# Patient Record
Sex: Female | Born: 1960 | Race: Black or African American | Hispanic: No | Marital: Married | Smoking: Never smoker
Health system: Southern US, Community
[De-identification: ages and names within clinical notes are randomized; demographics above are authoritative.]

## PROBLEM LIST (undated history)

## (undated) DIAGNOSIS — E669 Obesity, unspecified: Secondary | ICD-10-CM

## (undated) HISTORY — PX: OTHER SURGICAL HISTORY: SHX169

## (undated) HISTORY — PX: SHOULDER SURGERY: SHX246

---

## 2006-01-09 ENCOUNTER — Inpatient Hospital Stay (HOSPITAL_COMMUNITY): Admission: AD | Admit: 2006-01-09 | Discharge: 2006-01-09 | Payer: Self-pay | Admitting: Family Medicine

## 2006-08-10 ENCOUNTER — Emergency Department (HOSPITAL_COMMUNITY): Admission: EM | Admit: 2006-08-10 | Discharge: 2006-08-10 | Payer: Self-pay | Admitting: Emergency Medicine

## 2006-10-26 ENCOUNTER — Emergency Department (HOSPITAL_COMMUNITY): Admission: EM | Admit: 2006-10-26 | Discharge: 2006-10-26 | Payer: Self-pay | Admitting: Emergency Medicine

## 2006-12-04 ENCOUNTER — Emergency Department (HOSPITAL_COMMUNITY): Admission: EM | Admit: 2006-12-04 | Discharge: 2006-12-04 | Payer: Self-pay | Admitting: Emergency Medicine

## 2007-03-10 ENCOUNTER — Emergency Department (HOSPITAL_COMMUNITY): Admission: EM | Admit: 2007-03-10 | Discharge: 2007-03-10 | Payer: Self-pay | Admitting: Emergency Medicine

## 2007-04-14 ENCOUNTER — Emergency Department (HOSPITAL_COMMUNITY): Admission: EM | Admit: 2007-04-14 | Discharge: 2007-04-14 | Payer: Self-pay | Admitting: Emergency Medicine

## 2007-06-13 ENCOUNTER — Emergency Department (HOSPITAL_COMMUNITY): Admission: EM | Admit: 2007-06-13 | Discharge: 2007-06-13 | Payer: Self-pay | Admitting: Emergency Medicine

## 2007-07-19 ENCOUNTER — Emergency Department (HOSPITAL_COMMUNITY): Admission: EM | Admit: 2007-07-19 | Discharge: 2007-07-20 | Payer: Self-pay | Admitting: Emergency Medicine

## 2007-09-13 ENCOUNTER — Emergency Department (HOSPITAL_COMMUNITY): Admission: EM | Admit: 2007-09-13 | Discharge: 2007-09-13 | Payer: Self-pay | Admitting: Emergency Medicine

## 2008-03-29 ENCOUNTER — Emergency Department (HOSPITAL_COMMUNITY): Admission: EM | Admit: 2008-03-29 | Discharge: 2008-03-29 | Payer: Self-pay | Admitting: Emergency Medicine

## 2008-09-01 ENCOUNTER — Emergency Department (HOSPITAL_COMMUNITY): Admission: EM | Admit: 2008-09-01 | Discharge: 2008-09-01 | Payer: Self-pay | Admitting: Emergency Medicine

## 2008-12-22 ENCOUNTER — Emergency Department (HOSPITAL_COMMUNITY): Admission: EM | Admit: 2008-12-22 | Discharge: 2008-12-22 | Payer: Self-pay | Admitting: Emergency Medicine

## 2009-04-11 ENCOUNTER — Emergency Department (HOSPITAL_COMMUNITY): Admission: EM | Admit: 2009-04-11 | Discharge: 2009-04-11 | Payer: Self-pay | Admitting: Emergency Medicine

## 2009-07-13 ENCOUNTER — Emergency Department (HOSPITAL_COMMUNITY): Admission: EM | Admit: 2009-07-13 | Discharge: 2009-07-13 | Payer: Self-pay | Admitting: Emergency Medicine

## 2009-08-11 ENCOUNTER — Emergency Department (HOSPITAL_COMMUNITY): Admission: EM | Admit: 2009-08-11 | Discharge: 2009-08-12 | Payer: Self-pay | Admitting: Emergency Medicine

## 2009-10-10 ENCOUNTER — Emergency Department (HOSPITAL_COMMUNITY): Admission: EM | Admit: 2009-10-10 | Discharge: 2009-10-10 | Payer: Self-pay | Admitting: Emergency Medicine

## 2010-02-07 ENCOUNTER — Emergency Department (HOSPITAL_COMMUNITY): Admission: EM | Admit: 2010-02-07 | Discharge: 2010-02-07 | Payer: Self-pay | Admitting: Emergency Medicine

## 2010-02-10 ENCOUNTER — Emergency Department (HOSPITAL_COMMUNITY): Admission: EM | Admit: 2010-02-10 | Discharge: 2010-02-11 | Payer: Self-pay | Admitting: Emergency Medicine

## 2010-03-05 ENCOUNTER — Emergency Department (HOSPITAL_COMMUNITY): Admission: EM | Admit: 2010-03-05 | Discharge: 2010-03-05 | Payer: Self-pay | Admitting: Emergency Medicine

## 2010-03-12 ENCOUNTER — Emergency Department (HOSPITAL_COMMUNITY): Admission: EM | Admit: 2010-03-12 | Discharge: 2010-03-12 | Payer: Self-pay | Admitting: Emergency Medicine

## 2010-05-11 ENCOUNTER — Emergency Department (HOSPITAL_COMMUNITY)
Admission: EM | Admit: 2010-05-11 | Discharge: 2010-05-11 | Payer: Self-pay | Source: Home / Self Care | Admitting: Emergency Medicine

## 2010-08-08 LAB — URINALYSIS, ROUTINE W REFLEX MICROSCOPIC
Glucose, UA: 100 mg/dL — AB
Hgb urine dipstick: NEGATIVE
Specific Gravity, Urine: 1.013 (ref 1.005–1.030)

## 2010-08-18 LAB — CBC
HCT: 35.7 % — ABNORMAL LOW (ref 36.0–46.0)
MCV: 90 fL (ref 78.0–100.0)
Platelets: 238 10*3/uL (ref 150–400)
RBC: 3.96 MIL/uL (ref 3.87–5.11)
WBC: 5.9 10*3/uL (ref 4.0–10.5)

## 2010-08-18 LAB — DIFFERENTIAL
Basophils Absolute: 0.1 10*3/uL (ref 0.0–0.1)
Lymphocytes Relative: 36 % (ref 12–46)
Lymphs Abs: 2.2 10*3/uL (ref 0.7–4.0)
Monocytes Absolute: 0.5 10*3/uL (ref 0.1–1.0)
Monocytes Relative: 8 % (ref 3–12)
Neutrophils Relative %: 51 % (ref 43–77)

## 2010-08-18 LAB — POCT CARDIAC MARKERS: Troponin i, poc: <0.05 ng/mL (ref 0.00–0.09)

## 2010-08-18 LAB — COMPREHENSIVE METABOLIC PANEL
ALT: 16 U/L (ref 0–35)
Albumin: 3.7 g/dL (ref 3.5–5.2)
Alkaline Phosphatase: 64 U/L (ref 39–117)
CO2: 29 mEq/L (ref 19–32)
Calcium: 9.2 mg/dL (ref 8.4–10.5)
Chloride: 108 mEq/L (ref 96–112)
Creatinine, Ser: 0.87 mg/dL (ref 0.4–1.2)
Glucose, Bld: 106 mg/dL — ABNORMAL HIGH (ref 70–99)
Potassium: 3.9 mEq/L (ref 3.5–5.1)

## 2010-08-28 LAB — RAPID STREP SCREEN (MED CTR MEBANE ONLY): Streptococcus, Group A Screen (Direct): NEGATIVE

## 2010-09-04 LAB — BASIC METABOLIC PANEL
CO2: 28 mEq/L (ref 19–32)
Chloride: 107 mEq/L (ref 96–112)
Creatinine, Ser: 0.98 mg/dL (ref 0.4–1.2)
GFR calc Af Amer: >60 mL/min (ref >60)
GFR calc non Af Amer: >60 mL/min (ref >60)
Glucose, Bld: 119 mg/dL — ABNORMAL HIGH (ref 70–99)
Potassium: 4.2 mEq/L (ref 3.5–5.1)
Sodium: 140 mEq/L (ref 135–145)

## 2010-09-04 LAB — URINALYSIS, ROUTINE W REFLEX MICROSCOPIC
Glucose, UA: NEGATIVE mg/dL
Hgb urine dipstick: NEGATIVE
Specific Gravity, Urine: 1.015 (ref 1.005–1.030)
pH: 6 (ref 5.0–8.0)

## 2010-09-04 LAB — PREGNANCY, URINE: Preg Test, Ur: NEGATIVE

## 2010-09-04 LAB — CBC: WBC: 7.1 10*3/uL (ref 4.0–10.5)

## 2010-11-01 ENCOUNTER — Emergency Department (HOSPITAL_COMMUNITY)
Admission: EM | Admit: 2010-11-01 | Discharge: 2010-11-01 | Disposition: A | Discharge status: Home / Self Care | Payer: Self-pay | Attending: Emergency Medicine | Admitting: Emergency Medicine

## 2010-11-01 DIAGNOSIS — E669 Obesity, unspecified: Secondary | ICD-10-CM | POA: Insufficient documentation

## 2010-11-01 DIAGNOSIS — R42 Dizziness and giddiness: Secondary | ICD-10-CM | POA: Insufficient documentation

## 2010-11-01 DIAGNOSIS — R55 Syncope and collapse: Secondary | ICD-10-CM | POA: Insufficient documentation

## 2010-11-01 DIAGNOSIS — J45909 Unspecified asthma, uncomplicated: Secondary | ICD-10-CM | POA: Insufficient documentation

## 2010-11-01 DIAGNOSIS — D573 Sickle-cell trait: Secondary | ICD-10-CM | POA: Insufficient documentation

## 2010-11-01 LAB — POCT I-STAT, CHEM 8
BUN: 12 mg/dL (ref 6–23)
Chloride: 105 mEq/L (ref 96–112)
Creatinine, Ser: 1.1 mg/dL (ref 0.4–1.2)
Glucose, Bld: 103 mg/dL — ABNORMAL HIGH (ref 70–99)
HCT: 39 % (ref 36.0–46.0)
Hemoglobin: 13.3 g/dL (ref 12.0–15.0)
Potassium: 4.1 mEq/L (ref 3.5–5.1)
Sodium: 145 mEq/L (ref 135–145)

## 2011-02-18 LAB — DIFFERENTIAL
Basophils Absolute: 0
Basophils Relative: 1
Lymphs Abs: 2
Monocytes Absolute: 0.5
Neutrophils Relative %: 63

## 2011-02-18 LAB — POCT CARDIAC MARKERS
Myoglobin, poc: 104
Myoglobin, poc: 86.8
Operator id: 151321
Operator id: 151321
Troponin i, poc: <0.05
Troponin i, poc: <0.05

## 2011-02-18 LAB — CBC
HCT: 35.7 — ABNORMAL LOW
Hemoglobin: 12.1
MCHC: 34.1
MCV: 88.9
Platelets: 245
RDW: 14.2

## 2011-02-18 LAB — POCT I-STAT, CHEM 8
Calcium, Ion: 1.09 — ABNORMAL LOW
HCT: 38
Sodium: 141

## 2011-03-05 LAB — DIFFERENTIAL
Lymphs Abs: 2.2
Monocytes Relative: 5
Neutro Abs: 4.5
Neutrophils Relative %: 62

## 2011-03-05 LAB — CBC
HCT: 35.9 — ABNORMAL LOW
Hemoglobin: 12.1
MCHC: 33.7
RDW: 13.7

## 2011-03-05 LAB — COMPREHENSIVE METABOLIC PANEL
BUN: 10
Calcium: 8.9
Glucose, Bld: 96
Total Protein: 6.8

## 2011-06-25 ENCOUNTER — Emergency Department (HOSPITAL_COMMUNITY)
Admission: EM | Admit: 2011-06-25 | Discharge: 2011-06-25 | Disposition: A | Discharge status: Home / Self Care | Payer: Self-pay | Attending: Emergency Medicine | Admitting: Emergency Medicine

## 2011-06-25 ENCOUNTER — Encounter (HOSPITAL_COMMUNITY): Payer: Self-pay | Admitting: *Deleted

## 2011-06-25 DIAGNOSIS — K089 Disorder of teeth and supporting structures, unspecified: Secondary | ICD-10-CM | POA: Insufficient documentation

## 2011-06-25 DIAGNOSIS — R221 Localized swelling, mass and lump, neck: Secondary | ICD-10-CM | POA: Insufficient documentation

## 2011-06-25 DIAGNOSIS — J45909 Unspecified asthma, uncomplicated: Secondary | ICD-10-CM | POA: Insufficient documentation

## 2011-06-25 DIAGNOSIS — K029 Dental caries, unspecified: Secondary | ICD-10-CM | POA: Insufficient documentation

## 2011-06-25 DIAGNOSIS — R220 Localized swelling, mass and lump, head: Secondary | ICD-10-CM | POA: Insufficient documentation

## 2011-06-25 HISTORY — DX: Obesity, unspecified: E66.9

## 2011-06-25 MED ORDER — OXYCODONE-ACETAMINOPHEN 5-325 MG PO TABS
1.0000 | ORAL_TABLET | Freq: Once | ORAL | Status: AC
  Administered 2011-06-25: 1 via ORAL
  Filled 2011-06-25: qty 1

## 2011-06-25 MED ORDER — PENICILLIN V POTASSIUM 500 MG PO TABS: 500.0000 mg | ORAL_TABLET | Freq: Three times a day (TID) | ORAL | Status: AC

## 2011-06-25 MED ORDER — HYDROCODONE-ACETAMINOPHEN 5-500 MG PO TABS: 1.0000 | ORAL_TABLET | Freq: Four times a day (QID) | ORAL | Status: AC | PRN

## 2011-06-25 NOTE — ED Provider Notes (Signed)
Medical screening examination/treatment/procedure(s) were performed by non-physician practitioner and as supervising physician I was immediately available for consultation/collaboration.  Maikol Grassia L Husain Costabile, MD 06/25/11 1704 

## 2011-06-25 NOTE — ED Provider Notes (Signed)
History     CSN: [id number]  Arrival date & time 06/25/11  4098   First MD Initiated Contact with Patient 06/25/11 [phone]      Chief Complaint  Patient presents with  . Dental Pain    (Consider location/radiation/quality/duration/timing/severity/associated sxs/prior treatment) Patient is a 51 y.o. female presenting with tooth pain. The history is provided by the patient.  Dental PainThe primary symptoms include mouth pain. Primary symptoms do not include oral bleeding, fever or sore throat. The symptoms began 2 to 6 hours ago. The symptoms are worsening. The symptoms occur constantly.  Additional symptoms include: facial swelling. Additional symptoms do not include: ear pain.  Pt reports decayed tooth on the left lower jaw. States pain on and off for a year, but never as bad as it is now. States pain woke her up from sleep in the middle of the night. Pain worsened with chewing. States feels like left face is swelling. Denies fever, chills, malaise, difficulty swallowing.   Past Medical History  Diagnosis Date  . Asthma   . Obesity     History reviewed. No pertinent past surgical history.  History reviewed. No pertinent family history.  History  Substance Use Topics  . Smoking status: Not on file  . Smokeless tobacco: Not on file  . Alcohol Use: No    OB History    Grav Para Term Preterm Abortions TAB SAB Ect Mult Living                  Review of Systems  Constitutional: Negative for fever and chills.  HENT: Positive for facial swelling and dental problem. Negative for ear pain, congestion, sore throat, neck pain and neck stiffness.   Eyes: Negative.   Respiratory: Negative.   Cardiovascular: Negative.   Gastrointestinal: Negative.   Genitourinary: Negative.   Musculoskeletal: Negative.   Skin: Negative.   Neurological: Negative.   Psychiatric/Behavioral: Negative.     Allergies  Review of patient's allergies indicates no known allergies.  Home Medications    Current Outpatient Rx  Name Route Sig Dispense Refill  . ACETAMINOPHEN 500 MG PO TABS Oral Take 1,000 mg by mouth every 6 (six) hours as needed. pain    . IBUPROFEN 200 MG PO TABS Oral Take 400 mg by mouth every 6 (six) hours as needed. pain    . THERA M PLUS PO TABS Oral Take 1 tablet by mouth daily.      BP 155/83  Pulse 71  Temp(Src) 98.4 F (36.9 C) (Oral)  Resp 20  SpO2 96%  Physical Exam  Nursing note and vitals reviewed. Constitutional: She is oriented to person, place, and time. She appears well-developed and well-nourished. No distress.  HENT:  Head: Normocephalic.  Right Ear: External ear normal.  Left Ear: External ear normal.  Nose: Nose normal.  Mouth/Throat: Oropharynx is clear and moist.       No obvious facial swelling noted or palpated. Poor dentition. Left 1st molar decayed all the way to the gum. Surrounding gum is erythemous. No drainage, no palpable abscess.  Eyes: Conjunctivae are normal.  Neck: Neck supple.  Cardiovascular: Normal rate, regular rhythm and normal heart sounds.   Pulmonary/Chest: Effort normal and breath sounds normal. No respiratory distress.  Lymphadenopathy:    She has no cervical adenopathy.  Neurological: She is alert and oriented to person, place, and time.  Skin: Skin is warm and dry. No erythema.  Psychiatric: She has a normal mood and affect.    ED  Course  Procedures (including critical care time)  Exam consistent with dental carries and possible early infection. Will start on antibiotic, pain management, and follow up with oral surgery. I suspect what is remaining of the tooth will have to by incised out.  No diagnosis found.    MDM          Lottie Mussel, PA 06/25/11 0900

## 2011-06-25 NOTE — ED Notes (Signed)
Onset this am of left upper dental pain. Airway intact, resp e/u.

## 2011-08-29 ENCOUNTER — Emergency Department (HOSPITAL_COMMUNITY)
Admission: EM | Admit: 2011-08-29 | Discharge: 2011-08-29 | Disposition: A | Discharge status: Home / Self Care | Payer: Self-pay | Attending: Emergency Medicine | Admitting: Emergency Medicine

## 2011-08-29 ENCOUNTER — Encounter (HOSPITAL_COMMUNITY): Payer: Self-pay | Admitting: *Deleted

## 2011-08-29 DIAGNOSIS — S335XXA Sprain of ligaments of lumbar spine, initial encounter: Secondary | ICD-10-CM | POA: Insufficient documentation

## 2011-08-29 DIAGNOSIS — S39012A Strain of muscle, fascia and tendon of lower back, initial encounter: Secondary | ICD-10-CM

## 2011-08-29 DIAGNOSIS — J45909 Unspecified asthma, uncomplicated: Secondary | ICD-10-CM | POA: Insufficient documentation

## 2011-08-29 DIAGNOSIS — X58XXXA Exposure to other specified factors, initial encounter: Secondary | ICD-10-CM | POA: Insufficient documentation

## 2011-08-29 DIAGNOSIS — E669 Obesity, unspecified: Secondary | ICD-10-CM | POA: Insufficient documentation

## 2011-08-29 MED ORDER — HYDROCODONE-ACETAMINOPHEN 5-500 MG PO TABS: 1.0000 | ORAL_TABLET | Freq: Four times a day (QID) | ORAL | Status: AC | PRN

## 2011-08-29 MED ORDER — CYCLOBENZAPRINE HCL 10 MG PO TABS: 10.0000 mg | ORAL_TABLET | Freq: Two times a day (BID) | ORAL | Status: AC | PRN

## 2011-08-29 MED ORDER — OXYCODONE-ACETAMINOPHEN 5-325 MG PO TABS: 2.0000 | ORAL_TABLET | ORAL | Status: AC | PRN

## 2011-08-29 NOTE — ED Provider Notes (Signed)
History     CSN: [id number]  Arrival date & time 08/29/11  1637   First MD Initiated Contact with Patient 08/29/11 1816      Chief Complaint  Patient presents with  . Back Pain    (Consider location/radiation/quality/duration/timing/severity/associated sxs/prior treatment) Patient is a 51 y.o. female presenting with back pain. The history is provided by the patient.  Back Pain  This is a recurrent problem. The current episode started more than 2 days ago. The problem occurs constantly. The problem has not changed since onset.The pain is associated with no known injury. The pain is present in the lumbar spine. The quality of the pain is described as aching. Radiates to: right flank. The pain is at a severity of 6/10. The pain is moderate. The symptoms are aggravated by bending, certain positions and twisting. Pertinent negatives include no perianal numbness, no bladder incontinence, no dysuria, no leg pain, no paresthesias, no paresis, no tingling and no weakness.    Past Medical History  Diagnosis Date  . Asthma   . Obesity     History reviewed. No pertinent past surgical history.  History reviewed. No pertinent family history.  History  Substance Use Topics  . Smoking status: Not on file  . Smokeless tobacco: Not on file  . Alcohol Use: No    OB History    Grav Para Term Preterm Abortions TAB SAB Ect Mult Living                  Review of Systems  Genitourinary: Negative for bladder incontinence and dysuria.  Musculoskeletal: Positive for back pain.  Neurological: Negative for tingling, weakness and paresthesias.  All other systems reviewed and are negative.    Allergies  Review of patient's allergies indicates no known allergies.  Home Medications   Current Outpatient Rx  Name Route Sig Dispense Refill  . IBUPROFEN 200 MG PO TABS Oral Take 400 mg by mouth every 6 (six) hours as needed. pain    . THERA M PLUS PO TABS Oral Take 1 tablet by mouth daily.       BP 140/66  Pulse 60  Temp(Src) 97.9 F (36.6 C) (Oral)  Resp 16  SpO2 99%  Physical Exam  Nursing note and vitals reviewed. Constitutional: She appears well-developed and well-nourished.       Morbidly obese  HENT:  Head: Normocephalic and atraumatic.  Eyes: Conjunctivae and EOM are normal. Pupils are equal, round, and reactive to light.  Neck: Normal range of motion. Neck supple.  Cardiovascular: Normal rate, regular rhythm, normal heart sounds and intact distal pulses.   Pulmonary/Chest: Effort normal and breath sounds normal.  Abdominal: Soft. Bowel sounds are normal.  Musculoskeletal: Normal range of motion.  Neurological: She is alert.  Skin: Skin is warm and dry.  Psychiatric: She has a normal mood and affect. Thought content normal.    ED Course  Procedures (including critical care time)  Labs Reviewed - No data to display No results found.   No diagnosis found.    MDM          Hilario Quarry, MD 09/01/11 [phone]

## 2011-08-29 NOTE — ED Notes (Signed)
To ed for eval of right lower back pain for past 2 days. No known injury

## 2011-08-29 NOTE — Discharge Instructions (Signed)
Back Pain, Adult Low back pain is very common. About 1 in 5 people have back pain.The cause of low back pain is rarely dangerous. The pain often gets better over time.About half of people with a sudden onset of back pain feel better in just 2 weeks. About 8 in 10 people feel better by 6 weeks.  CAUSES Some common causes of back pain include:  Strain of the muscles or ligaments supporting the spine.   Wear and tear (degeneration) of the spinal discs.   Arthritis.   Direct injury to the back.  DIAGNOSIS Most of the time, the direct cause of low back pain is not known.However, back pain can be treated effectively even when the exact cause of the pain is unknown.Answering your caregiver's questions about your overall health and symptoms is one of the most accurate ways to make sure the cause of your pain is not dangerous. If your caregiver needs more information, he or she may order lab work or imaging tests (X-rays or MRIs).However, even if imaging tests show changes in your back, this usually does not require surgery. HOME CARE INSTRUCTIONS For many people, back pain returns.Since low back pain is rarely dangerous, it is often a condition that people can learn to manageon their own.   Remain active. It is stressful on the back to sit or stand in one place. Do not sit, drive, or stand in one place for more than 30 minutes at a time. Take short walks on level surfaces as soon as pain allows.Try to increase the length of time you walk each day.   Do not stay in bed.Resting more than 1 or 2 days can delay your recovery.   Do not avoid exercise or work.Your body is made to move.It is not dangerous to be active, even though your back may hurt.Your back will likely heal faster if you return to being active before your pain is gone.   Pay attention to your body when you bend and lift. Many people have less discomfortwhen lifting if they bend their knees, keep the load close to their  bodies,and avoid twisting. Often, the most comfortable positions are those that put less stress on your recovering back.   Find a comfortable position to sleep. Use a firm mattress and lie on your side with your knees slightly bent. If you lie on your back, put a pillow under your knees.   Only take over-the-counter or prescription medicines as directed by your caregiver. Over-the-counter medicines to reduce pain and inflammation are often the most helpful.Your caregiver may prescribe muscle relaxant drugs.These medicines help dull your pain so you can more quickly return to your normal activities and healthy exercise.   Put ice on the injured area.   Put ice in a plastic bag.   Place a towel between your skin and the bag.   Leave the ice on for 15 to 20 minutes, 3 to 4 times a day for the first 2 to 3 days. After that, ice and heat may be alternated to reduce pain and spasms.   Ask your caregiver about trying back exercises and gentle massage. This may be of some benefit.   Avoid feeling anxious or stressed.Stress increases muscle tension and can worsen back pain.It is important to recognize when you are anxious or stressed and learn ways to manage it.Exercise is a great option.  SEEK MEDICAL CARE IF:  You have pain that is not relieved with rest or medicine.   You have   pain that does not improve in 1 week.   You have new symptoms.   You are generally not feeling well.  SEEK IMMEDIATE MEDICAL CARE IF:   You have pain that radiates from your back into your legs.   You develop new bowel or bladder control problems.   You have unusual weakness or numbness in your arms or legs.   You develop nausea or vomiting.   You develop abdominal pain.   You feel faint.  Document Released: 05/12/2005 Document Revised: 05/01/2011 Document Reviewed: 09/30/2010 [employer] Patient Information 2012 [employer]. 

## 2011-09-28 ENCOUNTER — Emergency Department (HOSPITAL_COMMUNITY)
Admission: EM | Admit: 2011-09-28 | Discharge: 2011-09-28 | Disposition: A | Discharge status: Home / Self Care | Payer: Self-pay | Attending: Emergency Medicine | Admitting: Emergency Medicine

## 2011-09-28 ENCOUNTER — Encounter (HOSPITAL_COMMUNITY): Payer: Self-pay | Admitting: Emergency Medicine

## 2011-09-28 DIAGNOSIS — K0889 Other specified disorders of teeth and supporting structures: Secondary | ICD-10-CM

## 2011-09-28 DIAGNOSIS — X58XXXA Exposure to other specified factors, initial encounter: Secondary | ICD-10-CM | POA: Insufficient documentation

## 2011-09-28 DIAGNOSIS — S025XXA Fracture of tooth (traumatic), initial encounter for closed fracture: Secondary | ICD-10-CM | POA: Insufficient documentation

## 2011-09-28 DIAGNOSIS — K029 Dental caries, unspecified: Secondary | ICD-10-CM | POA: Insufficient documentation

## 2011-09-28 MED ORDER — HYDROCODONE-ACETAMINOPHEN 5-325 MG PO TABS: 1.0000 | ORAL_TABLET | ORAL | Status: DC | PRN

## 2011-09-28 MED ORDER — PENICILLIN V POTASSIUM 500 MG PO TABS: 500.0000 mg | ORAL_TABLET | Freq: Three times a day (TID) | ORAL | Status: DC

## 2011-09-28 MED ORDER — IBUPROFEN 800 MG PO TABS: 800.0000 mg | ORAL_TABLET | Freq: Three times a day (TID) | ORAL | Status: DC

## 2011-09-28 NOTE — Discharge Instructions (Signed)
Dental Pain A tooth ache may be caused by cavities (tooth decay). Cavities expose the nerve of the tooth to air and hot or cold temperatures. It may come from an infection or abscess (also called a boil or furuncle) around your tooth. It is also often caused by dental caries (tooth decay). This causes the pain you are having. DIAGNOSIS  Your caregiver can diagnose this problem by exam. TREATMENT   If caused by an infection, it may be treated with medications which kill germs (antibiotics) and pain medications as prescribed by your caregiver. Take medications as directed.   Only take over-the-counter or prescription medicines for pain, discomfort, or fever as directed by your caregiver.   Whether the tooth ache today is caused by infection or dental disease, you should see your dentist as soon as possible for further care.  SEEK MEDICAL CARE IF: The exam and treatment you received today has been provided on an emergency basis only. This is not a substitute for complete medical or dental care. If your problem worsens or new problems (symptoms) appear, and you are unable to meet with your dentist, call or return to this location. SEEK IMMEDIATE MEDICAL CARE IF:   You have a fever.   You develop redness and swelling of your face, jaw, or neck.   You are unable to open your mouth.   You have severe pain uncontrolled by pain medicine.  MAKE SURE YOU:   Understand these instructions.   Will watch your condition.   Will get help right away if you are not doing well or get worse.  Document Released: 05/12/2005 Document Revised: 05/01/2011 Document Reviewed: 12/29/2007 [facility] Patient Information 2012 [place], [place]. RESOURCE GUIDE  Dental Problems  Patients with Medicaid: [facility]                     [id number]                                           Phone:  [phone]                                                  If unable to pay or uninsured,  contact:  [facility] or [facility]. to become qualified for the adult dental clinic.  Chronic Pain Problems Contact [facility]  [phone] Patients need to be referred by their primary care doctor.  Insufficient Money for Medicine Contact United Way:  call "211" or [facility] [phone].  No Primary Care Doctor Call [facility]  [phone] Other agencies that provide inexpensive medical care    [facility]  [phone]    [facility]  [phone]    [facility]  [phone]    [facility]  [phone]    [facility]  [phone]    [facility]  [phone]  Substance Abuse Resources Alcohol and Drug Services  325-254-2409 [facility] 7202647958 [facility] [place] [phone] Floydene Flock [phone] Residential & Outpatient Substance Abuse Program  ([phone]  Psychological Services [facility] Behavioral Health  [phone] [facility]  [phone] [facility] Mental Health   [phone] (emergency services [phone])  Abuse/Neglect  [facility] Child Abuse Hotline (289) 114-8371 [facility] Child Abuse Hotline 831-351-4326 (After Hours)  Emergency Shelter [facility] Ministries [phone]  Maternity Homes [facility] [place] [facility] [place] (737) [phone] [facility] ([phone]  MRSA Hotline #:   4032592308    [facility] Resources  [facility] [place]  United Way                           [facility] Dept. [address] [place]                     [address]         [address]  Blondell Reveal Phone:  [phone]                                  Phone:  [phone]                   Phone:  [phone]  [facility] Mental Health Phone:  [phone]  [facility] Child Abuse  Hotline [phone] (After Hours)

## 2011-09-28 NOTE — ED Notes (Signed)
Pt c/o right upper tooth pain x 3 days 

## 2011-09-28 NOTE — ED Provider Notes (Signed)
History  Scribed for Nat Christen, MD, the patient was seen in room STRE4/STRE4. This chart was scribed by Candelaria Stagers. The patient's care started at 2:25 PM    CSN: [id number]  Arrival date & time 09/28/11  1355   First MD Initiated Contact with Patient 09/28/11 1418      Chief Complaint  Patient presents with  . Dental Pain     HPI Joan Fisher is a 51 y.o. female who presents to the Emergency Department complaining of a toothache on the top right  that started three days ago.  Pt states that a few months ago the tooth broke.  She denies fever.  Nothing seems to make the pain better or worse.   She has h/o asthma.    Past Medical History  Diagnosis Date  . Asthma   . Obesity     History reviewed. No pertinent past surgical history.  History reviewed. No pertinent family history.  History  Substance Use Topics  . Smoking status: Not on file  . Smokeless tobacco: Not on file  . Alcohol Use: No    OB History    Grav Para Term Preterm Abortions TAB SAB Ect Mult Living                  Review of Systems  Constitutional: Negative for fever.  HENT: Positive for dental problem. Negative for facial swelling.   All other systems reviewed and are negative.    Allergies  Review of patient's allergies indicates no known allergies.  Home Medications   Current Outpatient Rx  Name Route Sig Dispense Refill  . IBUPROFEN 200 MG PO TABS Oral Take 400 mg by mouth every 6 (six) hours as needed. pain    . THERA M PLUS PO TABS Oral Take 1 tablet by mouth daily.      BP 130/71  Pulse 73  Temp(Src) 98.3 F (36.8 C) (Oral)  Resp 18  SpO2 100%  Physical Exam  Nursing note and vitals reviewed. Constitutional: She is oriented to person, place, and time. She appears well-developed and well-nourished. No distress.  HENT:  Head: Normocephalic and atraumatic.       Right upper canine is broken and decayed with mild tenderness to palpation of the gum.  Broken first  molar on the left upper side.  No facial swelling.   Eyes: Conjunctivae and EOM are normal. Pupils are equal, round, and reactive to light.  Neck: Normal range of motion. Neck supple.  Cardiovascular: Normal rate.   Pulmonary/Chest: Effort normal.  Musculoskeletal: Normal range of motion. She exhibits no tenderness.  Lymphadenopathy:    She has no cervical adenopathy.  Neurological: She is alert and oriented to person, place, and time. No cranial nerve deficit.  Skin: Skin is warm and dry. She is not diaphoretic.  Psychiatric: She has a normal mood and affect. Her behavior is normal. Judgment and thought content normal.    ED Course  Procedures  DIAGNOSTIC STUDIES: Oxygen Saturation is 100% on room air, normal by my interpretation.    COORDINATION OF CARE:     Labs Reviewed - No data to display No results found.   No diagnosis found.    MDM  Patient with possible early dental abscess at this time.  We'll place her on penicillin, Norco and ibuprofen for pain.  Given her the dental clinic resources list as well.  She shows no acute signs of systemic infection currently.  I personally performed the services described  in this documentation, which was scribed in my presence. The recorded information has been reviewed and considered.     Nat Christen, MD 09/28/11 [phone]

## 2011-10-01 ENCOUNTER — Encounter (HOSPITAL_COMMUNITY): Payer: Self-pay | Admitting: *Deleted

## 2011-10-01 ENCOUNTER — Emergency Department (HOSPITAL_COMMUNITY): Payer: Self-pay

## 2011-10-01 ENCOUNTER — Emergency Department (HOSPITAL_COMMUNITY)
Admission: EM | Admit: 2011-10-01 | Discharge: 2011-10-01 | Disposition: A | Discharge status: Home / Self Care | Payer: Self-pay | Attending: Emergency Medicine | Admitting: Emergency Medicine

## 2011-10-01 DIAGNOSIS — R0789 Other chest pain: Secondary | ICD-10-CM | POA: Insufficient documentation

## 2011-10-01 DIAGNOSIS — R0602 Shortness of breath: Secondary | ICD-10-CM | POA: Insufficient documentation

## 2011-10-01 DIAGNOSIS — R002 Palpitations: Secondary | ICD-10-CM | POA: Insufficient documentation

## 2011-10-01 DIAGNOSIS — R072 Precordial pain: Secondary | ICD-10-CM | POA: Insufficient documentation

## 2011-10-01 DIAGNOSIS — J45909 Unspecified asthma, uncomplicated: Secondary | ICD-10-CM | POA: Insufficient documentation

## 2011-10-01 DIAGNOSIS — D649 Anemia, unspecified: Secondary | ICD-10-CM

## 2011-10-01 LAB — CBC
Hemoglobin: 11.5 g/dL — ABNORMAL LOW (ref 12.0–15.0)
MCH: 29 pg (ref 26.0–34.0)
MCV: 88.4 fL (ref 78.0–100.0)
RBC: 3.97 MIL/uL (ref 3.87–5.11)

## 2011-10-01 LAB — BASIC METABOLIC PANEL
CO2: 29 mEq/L (ref 19–32)
Calcium: 9.4 mg/dL (ref 8.4–10.5)
Creatinine, Ser: 0.86 mg/dL (ref 0.50–1.10)
Glucose, Bld: 112 mg/dL — ABNORMAL HIGH (ref 70–99)
Sodium: 142 mEq/L (ref 135–145)

## 2011-10-01 LAB — POCT I-STAT TROPONIN I

## 2011-10-01 MED ORDER — ASPIRIN 81 MG PO CHEW
324.0000 mg | CHEWABLE_TABLET | Freq: Once | ORAL | Status: AC
  Administered 2011-10-01: 324 mg via ORAL
  Filled 2011-10-01: qty 4

## 2011-10-01 NOTE — ED Notes (Signed)
Pt reports mid chest pains that started last night, denies recent cough. No acute distress noted, ekg being done at triage.

## 2011-10-01 NOTE — ED Notes (Signed)
Patient transported to X-ray 

## 2011-10-01 NOTE — ED Notes (Signed)
MD at bedside. 

## 2011-10-01 NOTE — Discharge Instructions (Signed)
Please read and follow all provided instructions.  Your diagnoses today include:  1. Chest pain   2. Anemia     Tests performed today include:  An EKG of your heart  A chest x-ray  Cardiac enzymes - a blood test for heart muscle damage  Blood counts and electrolytes - showed mild anemia  Vital signs. See below for your results today.   Medications prescribed:   None  Follow-up instructions: Please follow-up with your primary care provider as soon as you can for further evaluation of your symptoms.   If you do not have a primary care doctor -- see below for referral information.   Return instructions:  SEEK IMMEDIATE MEDICAL ATTENTION IF:  You have severe chest pain, especially if the pain is crushing or pressure-like and spreads to the arms, back, neck, or jaw, or if you have sweating, nausea (feeling sick to your stomach), or shortness of breath. THIS IS AN EMERGENCY. Don't wait to see if the pain will go away. Get medical help at once. Call 911 or 0 (operator). DO NOT drive yourself to the hospital.   Your chest pain gets worse and does not go away with rest.   You have an attack of chest pain lasting longer than usual, despite rest and treatment with the medications your caregiver has prescribed.   You wake from sleep with chest pain or shortness of breath.  You feel dizzy or faint.  You have chest pain not typical of your usual pain for which you originally saw your caregiver.   You have any other emergent concerns regarding your health.  Additional Information: Chest pain comes from many different causes. Your caregiver has diagnosed you as having chest pain that is not specific for one problem, but does not require admission.  You are at low risk for an acute heart condition or other serious illness.   Your vital signs today were: BP 114/57  Pulse 58  Temp(Src) 98.3 F (36.8 C) (Oral)  Resp 16  SpO2 100% If your blood pressure (BP) was elevated above  135/85 this visit, please have this repeated by your doctor within one month. -------------- No Primary Care Doctor Call [facility]  [phone] Other agencies that provide inexpensive medical care    [facility]  [phone]    [facility] Internal Medicine  [phone]    [facility]  [phone]    [facility]  [phone]    [facility]  [phone]    [facility]  [phone] -------------- RESOURCE GUIDE:  Dental Problems  Patients with Medicaid: [facility] [phone]                                            [phone] [address] [employer] Phone:  [phone]                                                   Phone:  [phone]  If unable to pay or uninsured, contact:  [facility] or [facility]. to become qualified for the adult dental  clinic.  Chronic Pain Problems Contact [facility]  [phone] Patients need to be referred by their primary care doctor.  Insufficient Money for Medicine Contact United Way:  call "211" or [facility] [phone].  Psychological Services [facility] Behavioral Health  [phone] [facility]  [phone] [facility] Mental Health   ([phone] (emergency services [phone])  Substance Abuse Resources Alcohol and Drug Services  [phone] [facility] 8474622904 [facility] [place] [phone] Floydene Flock (857) [phone] Residential & Outpatient Substance Abuse Program  334-119-9023  Abuse/Neglect [facility] Child Abuse Hotline (936) 699-2754 [facility] Child Abuse Hotline (585)793-7335 (After Hours)  Emergency Shelter [facility] Ministries (616)362-5877  Maternity Homes [facility] [place] [facility] [place] [phone] [place] Services [phone]  [facility] Resources  [facility] [place]     United Way                          [facility] Dept. [address] [place]                       [address]      [address]  Blondell Reveal Phone:  [phone]                                   Phone:  [phone]                 Phone:  [phone]  [facility] Mental Health Phone:  [phone]  [facility] Child Abuse Hotline 760-250-3402 [phone]) [phone] (After Hours)

## 2011-10-01 NOTE — ED Notes (Signed)
Lab at bedside

## 2011-10-01 NOTE — ED Provider Notes (Signed)
History     CSN: [id number]  Arrival date & time 10/01/11  1503   First MD Initiated Contact with Patient 10/01/11 1634      Chief Complaint  Patient presents with  . Chest Pain    (Consider location/radiation/quality/duration/timing/severity/associated sxs/prior treatment) HPI Comments: Patient presents with history of chest pain, substernal, starting last night. First episode was last evening and lasted for approximately 3 seconds before resolving. Associated with shortness of breath. No diaphoresis, nausea or vomiting. Patient described pain as pressure. It did not radiate. Patient states she woke in the middle the night to go the bathroom and she had another episode that was like the first. Today while she was at work she had the sensation of palpitations for approximately 1 minute. No dizziness or syncope. Patient denies fevers. She denies leg swelling, use of estrogen medications, recent trips or immobilizations, recent surgeries. Denies history of blood clots. Patient states that she does not have a history of hypertension, hypercholesterolemia, diabetes however she admits to not seeing a doctor regularly. She does not smoke. Patient states she has a family history only for her mother who had 'heart problems' in her 48s. She did not have open heart surgery or stents as far as the daughter knows. Patient remembers her mother taking nitroglycerin for this pain. Patient also denies cocaine use.  Patient is a 51 y.o. female presenting with chest pain. The history is provided by the patient.  Chest Pain The chest pain began yesterday. Duration of episode(s) is 3 seconds. Chest pain occurs intermittently. The chest pain is resolved. The pain is currently at 0/10. The quality of the pain is described as pressure-like. The pain does not radiate. Primary symptoms include shortness of breath and palpitations. Pertinent negatives for primary symptoms include no fever, no cough, no wheezing, no  abdominal pain, no nausea, no vomiting and no dizziness.  The palpitations also occurred with shortness of breath. The palpitations did not occur with syncope or dizziness.  Pertinent negatives for associated symptoms include no diaphoresis and no lower extremity edema. She tried nothing for the symptoms. Risk factors include no known risk factors.  Pertinent negatives for past medical history include no diabetes, no hyperlipidemia (patient states she hasn't been checked) and no hypertension.  Her family medical history is significant for CAD in family.  Procedure history is negative for cardiac catheterization and exercise treadmill test.     Past Medical History  Diagnosis Date  . Asthma   . Obesity     History reviewed. No pertinent past surgical history.  History reviewed. No pertinent family history.  History  Substance Use Topics  . Smoking status: Not on file  . Smokeless tobacco: Not on file  . Alcohol Use: No    OB History    Grav Para Term Preterm Abortions TAB SAB Ect Mult Living                  Review of Systems  Constitutional: Negative for fever and diaphoresis.  HENT: Negative for neck pain.   Eyes: Negative for redness.  Respiratory: Positive for shortness of breath. Negative for cough and wheezing.   Cardiovascular: Positive for chest pain and palpitations. Negative for leg swelling.  Gastrointestinal: Negative for nausea, vomiting and abdominal pain.  Genitourinary: Negative for dysuria.  Musculoskeletal: Negative for back pain.  Skin: Negative for rash.  Neurological: Negative for dizziness, syncope and light-headedness.    Allergies  Review of patient's allergies indicates no known allergies.  Home Medications   Current Outpatient Rx  Name Route Sig Dispense Refill  . THERA M PLUS PO TABS Oral Take 1 tablet by mouth daily.    Marland Kitchen NAPROXEN SODIUM 220 MG PO TABS Oral Take 440 mg by mouth daily as needed. For pain      BP 129/65  Pulse 68   Temp(Src) 98.3 F (36.8 C) (Oral)  Resp 14  SpO2 99%  Physical Exam  Nursing note and vitals reviewed. Constitutional: She appears well-developed and well-nourished.       obese  HENT:  Head: Normocephalic and atraumatic.  Mouth/Throat: Mucous membranes are normal. Mucous membranes are not dry.  Eyes: Conjunctivae are normal.       Conjunctiva not pale  Neck: Trachea normal and normal range of motion. Neck supple. Normal carotid pulses and no JVD present. No muscular tenderness present. Carotid bruit is not present. No tracheal deviation present.  Cardiovascular: Normal rate, regular rhythm, S1 normal, S2 normal, normal heart sounds and intact distal pulses.  Exam reveals no decreased pulses.   No murmur heard. Pulmonary/Chest: Effort normal. No respiratory distress. She has no wheezes. She exhibits no tenderness.  Abdominal: Soft. Normal aorta and bowel sounds are normal. There is no tenderness. There is no rebound and no guarding.  Musculoskeletal: Normal range of motion.  Neurological: She is alert.  Skin: Skin is warm and dry. She is not diaphoretic. No cyanosis. No pallor.  Psychiatric: She has a normal mood and affect.    ED Course  Procedures (including critical care time)  Labs Reviewed  CBC - Abnormal; Notable for the following:    Hemoglobin 11.5 (*)    HCT 35.1 (*)    All other components within normal limits  BASIC METABOLIC PANEL - Abnormal; Notable for the following:    Glucose, Bld 112 (*)    GFR calc non Af Amer 77 (*)    GFR calc Af Amer 90 (*)    All other components within normal limits  POCT I-STAT TROPONIN I   Dg Chest 2 View  10/01/2011  *RADIOLOGY REPORT*  Clinical Data: Chest pain  CHEST - 2 VIEW  Comparison: 02/07/2010  Findings: Orthopedic screw projects over the right glenoid. Lungs clear.  Heart size and pulmonary vascularity normal.  No effusion. Visualized bones unremarkable.  IMPRESSION: No acute disease  Original Report Authenticated By: Thora Lance III, M.D.     1. Chest pain   2. Anemia     4:48 PM Patient seen and examined. Work-up initiated. Medications ordered. EKG reviewed.  Vital signs reviewed and are as follows: Filed Vitals:   10/01/11 1638  BP: 129/65  Pulse: 68  Temp:   Resp: 14   Doubt CAD however will check basic labs, trop, CXR given family history and unknown risk factors.    Date: 10/01/2011  Rate: 90  Rhythm: normal sinus rhythm  QRS Axis: normal  Intervals: normal  ST/T Wave abnormalities: normal  Conduction Disutrbances:none  Narrative Interpretation: early R/S transition, unchanged from prior.   Old EKG Reviewed: unchanged from 08/11/2009  6:51 PM Patient was discussed with Dr. Estell Harpin. CXR reviewed by myself.   [facility] referral given. Urged PCP follow-up, referrals given.   Patient was counseled to return with severe chest pain, especially if the pain is crushing or pressure-like and spreads to the arms, back, neck, or jaw, or if they have sweating, nausea, or shortness of breath with the pain. They were encouraged to call 911 with  these symptoms.   They were also told to return if their chest pain gets worse and does not go away with rest, they have an attack of chest pain lasting longer than usual despite rest and treatment with the medications their caregiver has prescribed, if they wake from sleep with chest pain or shortness of breath, if they feel dizzy or faint, if they have chest pain not typical of their usual pain, or if they have any other emergent concerns regarding their health.  The patient verbalized understanding and agreed.    MDM  Patient with chest tightness x 2 episodes lasting only several seconds, palpitations today.  Doubt cardiac origin of pain given: short duration of symptoms, neg cardiac enzymes, unchanged EKG, no exertional component, lack of accompanying sx including palps, sweating, radiation. Only significant known risk factor is unknown heart  problem of mother. No risk factors for PE. Do not suspect PE. O2 sat normal, not tachy, no SOB other than with CP.          [place], [place] 10/01/11 [phone]

## 2011-10-01 NOTE — ED Notes (Signed)
Introduced self to pt.  Dressed into gown and connected to cardiac monitor. Pt resting comfortably and has no requests at this time

## 2011-10-02 NOTE — ED Provider Notes (Signed)
Medical screening examination/treatment/procedure(s) were performed by non-physician practitioner and as supervising physician I was immediately available for consultation/collaboration.   Benny Lennert, MD 10/02/11 1536

## 2011-10-24 ENCOUNTER — Emergency Department (HOSPITAL_COMMUNITY)
Admission: EM | Admit: 2011-10-24 | Discharge: 2011-10-24 | Disposition: A | Discharge status: Home / Self Care | Payer: Self-pay | Attending: Emergency Medicine | Admitting: Emergency Medicine

## 2011-10-24 ENCOUNTER — Encounter (HOSPITAL_COMMUNITY): Payer: Self-pay | Admitting: Emergency Medicine

## 2011-10-24 DIAGNOSIS — E669 Obesity, unspecified: Secondary | ICD-10-CM | POA: Insufficient documentation

## 2011-10-24 DIAGNOSIS — S39012A Strain of muscle, fascia and tendon of lower back, initial encounter: Secondary | ICD-10-CM

## 2011-10-24 DIAGNOSIS — X500XXA Overexertion from strenuous movement or load, initial encounter: Secondary | ICD-10-CM | POA: Insufficient documentation

## 2011-10-24 DIAGNOSIS — S339XXA Sprain of unspecified parts of lumbar spine and pelvis, initial encounter: Secondary | ICD-10-CM | POA: Insufficient documentation

## 2011-10-24 LAB — URINALYSIS, ROUTINE W REFLEX MICROSCOPIC
Bilirubin Urine: NEGATIVE
Nitrite: NEGATIVE
Specific Gravity, Urine: 1.012 (ref 1.005–1.030)
pH: 6 (ref 5.0–8.0)

## 2011-10-24 MED ORDER — CYCLOBENZAPRINE HCL 10 MG PO TABS: 10.0000 mg | ORAL_TABLET | Freq: Three times a day (TID) | ORAL | Status: AC | PRN

## 2011-10-24 MED ORDER — OXYCODONE-ACETAMINOPHEN 5-325 MG PO TABS: 1.0000 | ORAL_TABLET | ORAL | Status: AC | PRN

## 2011-10-24 NOTE — ED Provider Notes (Signed)
This chart was scribed for Joan Booze, MD by Wallis Mart. The patient was seen in room STRE3/STRE3 and the patient's care was started at 3:58 PM.   CSN: [id number]  Arrival date & time 10/24/11  1505   First MD Initiated Contact with Patient 10/24/11 1551      Chief Complaint  Patient presents with  . Back Pain    (Consider location/radiation/quality/duration/timing/severity/associated sxs/prior treatment) HPI   Joan Fisher is a 51 y.o. female who presents to the Emergency Department complaining of sudden onset, persistence of constant, moderate to severe sharp lower back pain that radiates around to the front onset yesterday. Pt states that lying down in certain positions and getting up make the pain worse.  Sitting makes the pain better.  Pt rates the pain as 8/10  currently, the pain was 10/10 at worst. Pt took aleve and icy hot with mild relief. Pt had a similar episode 5 months ago after lifting a box. Pt has to bend and lift heavy object a lot at work. Pt denies any nausea, vomiting, weakness, numbness tingling, fevers, chills, sweats, bowel problems. There are no other associated symptoms and no other alleviating or aggravating factors.   Past Medical History  Diagnosis Date  . Asthma   . Obesity     Past Surgical History  Procedure Date  . Shoulder surgery     No family history on file.  History  Substance Use Topics  . Smoking status: Not on file  . Smokeless tobacco: Not on file  . Alcohol Use: Yes     wine occasional    OB History    Grav Para Term Preterm Abortions TAB SAB Ect Mult Living                  Review of Systems  Musculoskeletal: Positive for back pain.  All other systems reviewed and are negative.    Allergies  Review of patient's allergies indicates no known allergies.  Home Medications   Current Outpatient Rx  Name Route Sig Dispense Refill  . THERA M PLUS PO TABS Oral Take 1 tablet by mouth daily.    Marland Kitchen NAPROXEN SODIUM 220  MG PO TABS Oral Take 440 mg by mouth 3 (three) times daily as needed. For pain      BP 122/62  Pulse 75  Temp(Src) 98.4 F (36.9 C) (Oral)  Resp 14  SpO2 98%  Physical Exam  Nursing note and vitals reviewed. Constitutional: She is oriented to person, place, and time. She appears well-developed and well-nourished. No distress.  HENT:  Head: Normocephalic and atraumatic.  Eyes: EOM are normal.  Neck: Neck supple. No tracheal deviation present.  Cardiovascular: Normal rate.   Pulmonary/Chest: Effort normal. No respiratory distress.  Abdominal: There is no rebound and no guarding.       Mild tenderness right lower abdomen  Musculoskeletal: Normal range of motion.       Mild to moderate right perilumbar tenderness, positive straight leg raise on right at 60 degreees  Neurological: She is alert and oriented to person, place, and time.  Skin: Skin is warm and dry.  Psychiatric: She has a normal mood and affect. Her behavior is normal.    ED Course  Procedures (including critical care time) DIAGNOSTIC STUDIES: Oxygen Saturation is 99% on room air, normal by my interpretation.    COORDINATION OF CARE:    Results for orders placed during the hospital encounter of 10/24/11  URINALYSIS, ROUTINE W REFLEX MICROSCOPIC  Component Value Range   Color, Urine YELLOW  YELLOW    APPearance CLEAR  CLEAR    Specific Gravity, Urine 1.012  1.005 - 1.030    pH 6.0  5.0 - 8.0    Glucose, UA NEGATIVE  NEGATIVE (mg/dL)   Hgb urine dipstick NEGATIVE  NEGATIVE    Bilirubin Urine NEGATIVE  NEGATIVE    Ketones, ur NEGATIVE  NEGATIVE (mg/dL)   Protein, ur NEGATIVE  NEGATIVE (mg/dL)   Urobilinogen, UA 0.2  0.0 - 1.0 (mg/dL)   Nitrite NEGATIVE  NEGATIVE    Leukocytes, UA NEGATIVE  NEGATIVE      1. Lumbosacral strain       MDM  Back pain which appears completely muscular. Urinalysis has no pyuria or hematuria to suggest either UTI or ureterolithiasis, and straight leg raise being positive  goes along with musculoskeletal pain. She will be treated with cyclobenzaprine and Percocet and is advised on appropriate dose of Aleve.   I personally performed the services described in this documentation, which was scribed in my presence. The recorded information has been reviewed and considered.       Joan Booze, MD 10/24/11 [phone]

## 2011-10-24 NOTE — ED Notes (Signed)
Pt with back pain that began yesterday after working and lifting at her job. Pain described as tightness and radiates to right leg and right groin area. Pt denies any bowel, bladder incontinence or dysuria. Distal circulation intact. FAROM all extremities. Steady gait with obvious limp.

## 2011-10-24 NOTE — Discharge Instructions (Signed)
Take 2 Aleve tablets at a time twice a day.  Lumbosacral Strain Lumbosacral strain is one of the most common causes of back pain. There are many causes of back pain. Most are not serious conditions. CAUSES  Your backbone (spinal column) is made up of 24 main vertebral bodies, the sacrum, and the coccyx. These are held together by muscles and tough, fibrous tissue (ligaments). Nerve roots pass through the openings between the vertebrae. A sudden move or injury to the back may cause injury to, or pressure on, these nerves. This may result in localized back pain or pain movement (radiation) into the buttocks, down the leg, and into the foot. Sharp, shooting pain from the buttock down the back of the leg (sciatica) is frequently associated with a ruptured (herniated) disk. Pain may be caused by muscle spasm alone. Your caregiver can often find the cause of your pain by the details of your symptoms and an exam. In some cases, you may need tests (such as X-rays). Your caregiver will work with you to decide if any tests are needed based on your specific exam. HOME CARE INSTRUCTIONS   Avoid an underactive lifestyle. Active exercise, as directed by your caregiver, is your greatest weapon against back pain.   Avoid hard physical activities (tennis, racquetball, waterskiing) if you are not in proper physical condition for it. This may aggravate or create problems.   If you have a back problem, avoid sports requiring sudden body movements. Swimming and walking are generally safer activities.   Maintain good posture.   Avoid becoming overweight (obese).   Use bed rest for only the most extreme, sudden (acute) episode. Your caregiver will help you determine how much bed rest is necessary.   For acute conditions, you may put ice on the injured area.   Put ice in a plastic bag.   Place a towel between your skin and the bag.   Leave the ice on for 15 to 20 minutes at a time, every 2 hours, or as needed.     After you are improved and more active, it may help to apply heat for 30 minutes before activities.  See your caregiver if you are having pain that lasts longer than expected. Your caregiver can advise appropriate exercises or therapy if needed. With conditioning, most back problems can be avoided. SEEK IMMEDIATE MEDICAL CARE IF:   You have numbness, tingling, weakness, or problems with the use of your arms or legs.   You experience severe back pain not relieved with medicines.   There is a change in bowel or bladder control.   You have increasing pain in any area of the body, including your belly (abdomen).   You notice shortness of breath, dizziness, or feel faint.   You feel sick to your stomach (nauseous), are throwing up (vomiting), or become sweaty.   You notice discoloration of your toes or legs, or your feet get very cold.   Your back pain is getting worse.   You have a fever.  MAKE SURE YOU:   Understand these instructions.   Will watch your condition.   Will get help right away if you are not doing well or get worse.  Document Released: 02/19/2005 Document Revised: 05/01/2011 Document Reviewed: 08/11/2008 [facility] Patient Information 2012 [place], [place].  Back Exercises Back exercises help treat and prevent back injuries. The goal is to increase your strength in your belly (abdominal) and back muscles. These exercises can also help with flexibility. Start these exercises  when told by your doctor. HOME CARE Back exercises include: Pelvic Tilt.  Lie on your back with your knees bent. Tilt your pelvis until the lower part of your back is against the floor. Hold this position 5 to 10 sec. Repeat this exercise 5 to 10 times.  Knee to Chest.  Pull 1 knee up against your chest and hold for 20 to 30 seconds. Repeat this with the other knee. This may be done with the other leg straight or bent, whichever feels better. Then, pull both knees up against your chest.   Sit-Ups or Curl-Ups.  Bend your knees 90 degrees. Start with tilting your pelvis, and do a partial, slow sit-up. Only lift your upper half 30 to 45 degrees off the floor. Take at least 2 to 3 seonds for each sit-up. Do not do sit-ups with your knees out straight. If partial sit-ups are difficult, simply do the above but with only tightening your belly (abdominal) muscles and holding it as told.  Hip-Lift.  Lie on your back with your knees flexed 90 degrees. Push down with your feet and shoulders as you raise your hips 2 inches off the floor. Hold for 10 seconds, repeat 5 to 10 times.  Back Arches.  Lie on your stomach. Prop yourself up on bent elbows. Slowly press on your hands, causing an arch in your low back. Repeat 3 to 5 times.  Shoulder-Lifts.  Lie face down with arms beside your body. Keep hips and belly pressed to floor as you slowly lift your head and shoulders off the floor.  Do not overdo your exercises. Be careful in the beginning. Exercises may cause you some mild back discomfort. If the pain lasts for more than 15 minutes, stop the exercises until you see your doctor. Improvement with exercise for back problems is slow.  Document Released: 06/14/2010 Document Revised: 05/01/2011 Document Reviewed: 06/14/2010 [facility] Patient Information 2012 [place], [place].

## 2011-10-24 NOTE — ED Notes (Signed)
C/o R lower back pain that radiates to R pelvic area since yesterday.  Denies urinary symptoms.  States she does a lot of lifting at work.  Pain worse when lying.

## 2012-01-05 ENCOUNTER — Encounter (HOSPITAL_COMMUNITY): Payer: Self-pay | Admitting: Adult Health

## 2012-01-05 ENCOUNTER — Emergency Department (HOSPITAL_COMMUNITY)
Admission: EM | Admit: 2012-01-05 | Discharge: 2012-01-05 | Disposition: A | Discharge status: Home / Self Care | Payer: Self-pay | Attending: Emergency Medicine | Admitting: Emergency Medicine

## 2012-01-05 DIAGNOSIS — K089 Disorder of teeth and supporting structures, unspecified: Secondary | ICD-10-CM | POA: Insufficient documentation

## 2012-01-05 DIAGNOSIS — K0889 Other specified disorders of teeth and supporting structures: Secondary | ICD-10-CM

## 2012-01-05 DIAGNOSIS — E669 Obesity, unspecified: Secondary | ICD-10-CM | POA: Insufficient documentation

## 2012-01-05 MED ORDER — PENICILLIN V POTASSIUM 500 MG PO TABS: 500.0000 mg | ORAL_TABLET | Freq: Three times a day (TID) | ORAL | Status: AC

## 2012-01-05 MED ORDER — HYDROCODONE-ACETAMINOPHEN 5-325 MG PO TABS
2.0000 | ORAL_TABLET | Freq: Once | ORAL | Status: AC
  Administered 2012-01-05: 2 via ORAL
  Filled 2012-01-05: qty 2

## 2012-01-05 MED ORDER — PENICILLIN V POTASSIUM 250 MG PO TABS
500.0000 mg | ORAL_TABLET | Freq: Once | ORAL | Status: AC
  Administered 2012-01-05: 500 mg via ORAL
  Filled 2012-01-05: qty 2

## 2012-01-05 MED ORDER — HYDROCODONE-ACETAMINOPHEN 5-325 MG PO TABS
1.0000 | ORAL_TABLET | ORAL | Status: AC | PRN
Start: 2012-01-05 — End: 2012-01-15

## 2012-01-05 NOTE — ED Provider Notes (Signed)
History   This chart was scribed for Hilario Quarry, MD by Melba Coon. The patient was seen in room TR09C/TR09C and the patient's care was started at 9:22PM.    CSN: [id number]  Arrival date & time 01/05/12  2107   First MD Initiated Contact with Patient 01/05/12 2116      Chief Complaint  Patient presents with  . Dental Pain    (Consider location/radiation/quality/duration/timing/severity/associated sxs/prior treatment) The history is provided by the patient. No language interpreter was used.   Joan Fisher is a 50 y.o. female who presents to the Emergency Department complaining of constant, moderate to severe right upper dental pain with an onset 3 weeks ago. Pt's tooth broke off around onset. Tylenol does not alleviate the pain. Pt does not have a regular dentist. Swelling around the area of pain is present. No HA, fever, neck pain, sore throat, rash, back pain, CP, SOB, abd pain, n/v/d, dysuria, or extremity pain, edema, weakness, numbness, or tingling. No known allergies. No other pertinent medical symptoms.   Past Medical History  Diagnosis Date  . Asthma   . Obesity     Past Surgical History  Procedure Date  . Shoulder surgery     History reviewed. No pertinent family history.  History  Substance Use Topics  . Smoking status: Not on file  . Smokeless tobacco: Not on file  . Alcohol Use: Yes     wine occasional    OB History    Grav Para Term Preterm Abortions TAB SAB Ect Mult Living                  Review of Systems 10 Systems reviewed and all are negative for acute change except as noted in the HPI.   Allergies  Review of patient's allergies indicates no known allergies.  Home Medications   Current Outpatient Rx  Name Route Sig Dispense Refill  . THERA M PLUS PO TABS Oral Take 1 tablet by mouth daily.    Marland Kitchen NAPROXEN SODIUM 220 MG PO TABS Oral Take 440 mg by mouth 3 (three) times daily as needed. For pain      BP 147/92  Pulse 65  Temp  98.5 F (36.9 C) (Oral)  Resp 14  SpO2 99%  Physical Exam  Nursing note and vitals reviewed. Constitutional: She is oriented to person, place, and time. She appears well-developed and well-nourished. No distress.  HENT:  Head: Normocephalic and atraumatic.       Upper right incisor broken down to the gumline  Eyes: EOM are normal.  Neck: Neck supple. No tracheal deviation present.  Cardiovascular: Normal rate.   Pulmonary/Chest: Effort normal. No respiratory distress.  Musculoskeletal: Normal range of motion.  Neurological: She is alert and oriented to person, place, and time.  Skin: Skin is warm and dry.  Psychiatric: She has a normal mood and affect. Her behavior is normal.    ED Course  Procedures (including critical care time)  DIAGNOSTIC STUDIES: Oxygen Saturation is 99% on room air, normal by my interpretation.    COORDINATION OF CARE:  9:24PM - penicillin wil be Rx for the pt; pt advised to f/u with oral surgeon. Pt ready for d/c.   Labs Reviewed - No data to display No results found.   No diagnosis found.    MDM  I personally performed the services described in this documentation, which was scribed in my presence. The recorded information has been reviewed and considered.   Hilario Quarry,  MD 01/05/12 2233

## 2012-01-05 NOTE — ED Notes (Signed)
C/o 3 weeks upper right  incisor tooth pain that is intermittent. Tooth is broken and decayed,

## 2012-03-16 ENCOUNTER — Emergency Department (HOSPITAL_COMMUNITY): Payer: Self-pay

## 2012-03-16 ENCOUNTER — Encounter (HOSPITAL_COMMUNITY): Payer: Self-pay | Admitting: *Deleted

## 2012-03-16 DIAGNOSIS — R0602 Shortness of breath: Secondary | ICD-10-CM | POA: Insufficient documentation

## 2012-03-16 LAB — COMPREHENSIVE METABOLIC PANEL
Albumin: 3.8 g/dL (ref 3.5–5.2)
Alkaline Phosphatase: 73 U/L (ref 39–117)
BUN: 10 mg/dL (ref 6–23)
CO2: 27 mEq/L (ref 19–32)
Chloride: 106 mEq/L (ref 96–112)
Potassium: 3.7 mEq/L (ref 3.5–5.1)
Total Bilirubin: 0.2 mg/dL — ABNORMAL LOW (ref 0.3–1.2)

## 2012-03-16 LAB — CBC
HCT: 33.9 % — ABNORMAL LOW (ref 36.0–46.0)
Hemoglobin: 11.3 g/dL — ABNORMAL LOW (ref 12.0–15.0)
RBC: 3.88 MIL/uL (ref 3.87–5.11)
RDW: 13.7 % (ref 11.5–15.5)
WBC: 6.3 10*3/uL (ref 4.0–10.5)

## 2012-03-16 LAB — POCT I-STAT TROPONIN I: Troponin i, poc: 0.01 ng/mL (ref 0.00–0.08)

## 2012-03-16 NOTE — ED Notes (Signed)
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  Pain has continued to increase, so pt came to ED.  Pt is also sob, but she states that is the norm d/t her asthma.

## 2012-03-17 ENCOUNTER — Emergency Department (HOSPITAL_COMMUNITY)
Admission: EM | Admit: 2012-03-17 | Discharge: 2012-03-17 | Discharge status: Against Medical Advice | Payer: Self-pay | Attending: Emergency Medicine | Admitting: Emergency Medicine

## 2012-03-17 NOTE — ED Notes (Signed)
Attempted to call for pt x3 w/o response. Unable to locate pt at this time.

## 2012-03-17 NOTE — ED Notes (Signed)
No answer x1

## 2012-03-17 NOTE — ED Notes (Signed)
No answer

## 2012-03-17 NOTE — ED Notes (Signed)
No response from pt when called in waiting room.

## 2012-05-26 ENCOUNTER — Emergency Department (HOSPITAL_COMMUNITY)
Admission: EM | Admit: 2012-05-26 | Discharge: 2012-05-26 | Disposition: A | Discharge status: Home / Self Care | Payer: Self-pay | Attending: Emergency Medicine | Admitting: Emergency Medicine

## 2012-05-26 ENCOUNTER — Emergency Department (HOSPITAL_COMMUNITY): Payer: Self-pay

## 2012-05-26 DIAGNOSIS — M542 Cervicalgia: Secondary | ICD-10-CM | POA: Insufficient documentation

## 2012-05-26 DIAGNOSIS — E669 Obesity, unspecified: Secondary | ICD-10-CM | POA: Insufficient documentation

## 2012-05-26 DIAGNOSIS — R0602 Shortness of breath: Secondary | ICD-10-CM | POA: Insufficient documentation

## 2012-05-26 DIAGNOSIS — B349 Viral infection, unspecified: Secondary | ICD-10-CM

## 2012-05-26 DIAGNOSIS — R599 Enlarged lymph nodes, unspecified: Secondary | ICD-10-CM | POA: Insufficient documentation

## 2012-05-26 DIAGNOSIS — B9789 Other viral agents as the cause of diseases classified elsewhere: Secondary | ICD-10-CM | POA: Insufficient documentation

## 2012-05-26 DIAGNOSIS — J3489 Other specified disorders of nose and nasal sinuses: Secondary | ICD-10-CM | POA: Insufficient documentation

## 2012-05-26 DIAGNOSIS — R590 Localized enlarged lymph nodes: Secondary | ICD-10-CM

## 2012-05-26 DIAGNOSIS — J45901 Unspecified asthma with (acute) exacerbation: Secondary | ICD-10-CM | POA: Insufficient documentation

## 2012-05-26 LAB — BASIC METABOLIC PANEL
BUN: 15 mg/dL (ref 6–23)
Creatinine, Ser: 0.82 mg/dL (ref 0.50–1.10)
GFR calc Af Amer: >90 mL/min (ref >90)
GFR calc non Af Amer: 82 mL/min — ABNORMAL LOW (ref >90)

## 2012-05-26 LAB — CBC WITH DIFFERENTIAL/PLATELET
Basophils Relative: 0 % (ref 0–1)
Eosinophils Absolute: 0.1 10*3/uL (ref 0.0–0.7)
HCT: 35.1 % — ABNORMAL LOW (ref 36.0–46.0)
Hemoglobin: 11.4 g/dL — ABNORMAL LOW (ref 12.0–15.0)
MCH: 28.6 pg (ref 26.0–34.0)
MCHC: 32.5 g/dL (ref 30.0–36.0)
Monocytes Absolute: 0.5 10*3/uL (ref 0.1–1.0)
Monocytes Relative: 9 % (ref 3–12)
Neutrophils Relative %: 67 % (ref 43–77)

## 2012-05-26 MED ORDER — ALBUTEROL SULFATE (2.5 MG/3ML) 0.083% IN NEBU: 2.5000 mg | INHALATION_SOLUTION | Freq: Four times a day (QID) | RESPIRATORY_TRACT | Status: DC | PRN

## 2012-05-26 MED ORDER — ALBUTEROL SULFATE (5 MG/ML) 0.5% IN NEBU
5.0000 mg | INHALATION_SOLUTION | Freq: Once | RESPIRATORY_TRACT | Status: AC
  Administered 2012-05-26: 5 mg via RESPIRATORY_TRACT
  Filled 2012-05-26: qty 1

## 2012-05-26 MED ORDER — IPRATROPIUM BROMIDE 0.02 % IN SOLN
0.5000 mg | Freq: Once | RESPIRATORY_TRACT | Status: AC
  Administered 2012-05-26: 0.5 mg via RESPIRATORY_TRACT
  Filled 2012-05-26: qty 2.5

## 2012-05-26 MED ORDER — PREDNISONE 20 MG PO TABS: 40.0000 mg | ORAL_TABLET | Freq: Every day | ORAL | Status: DC

## 2012-05-26 MED ORDER — PREDNISONE 20 MG PO TABS
60.0000 mg | ORAL_TABLET | Freq: Once | ORAL | Status: AC
  Administered 2012-05-26: 60 mg via ORAL
  Filled 2012-05-26: qty 3

## 2012-05-26 NOTE — ED Notes (Signed)
Pt in c/o cough and shortness of breath since yesterday, pt with history of asthma, has been using albuterol inhaler at home but is out of neb treatments, pt speaking in full sentences, audible wheezing noted, no acute distress.

## 2012-05-26 NOTE — ED Provider Notes (Signed)
History     CSN: [id number]  Arrival date & time 05/26/12  [place]   First MD Initiated Contact with Patient 05/26/12 2015      No chief complaint on file.   (Consider location/radiation/quality/duration/timing/severity/associated sxs/prior treatment) HPI  Joan Fisher is a 52 y.o. female complaining of dry cough associated with chills and shortness of breath for 4 days. Patient also reports a left-sided posterior neck pain/ear/jaw starting this a.m. pain is rated at 5/10, no bleeding are exacerbating factors identified, not relieved with Tylenol. She endorses rhinorrhea. Denies chest pain, fever, or emesis, change in bowel or bladder habits. Patient past medical history significant only for asthma: She has not had any hospitalizations or intubations.  Past Medical History  Diagnosis Date  . Asthma   . Obesity     Past Surgical History  Procedure Date  . Shoulder surgery   . Dislocated shoulder repair     r    No family history on file.  History  Substance Use Topics  . Smoking status: Never Smoker   . Smokeless tobacco: Not on file  . Alcohol Use: Yes     Comment: wine occasional    OB History    Grav Para Term Preterm Abortions TAB SAB Ect Mult Living                  Review of Systems  Constitutional: Negative for fever.  HENT: Positive for rhinorrhea and neck pain.   Respiratory: Positive for cough and shortness of breath.   Cardiovascular: Negative for chest pain.  Gastrointestinal: Negative for nausea, vomiting, abdominal pain and diarrhea.  All other systems reviewed and are negative.    Allergies  Percocet  Home Medications  No current outpatient prescriptions on file.  BP 149/79  Pulse 84  Temp 98.1 F (36.7 C) (Oral)  Resp 16  SpO2 100%  Physical Exam  Nursing note and vitals reviewed. Constitutional: She is oriented to person, place, and time. She appears well-developed and well-nourished. No distress.  HENT:  Head: Normocephalic.    Right Ear: External ear normal.  Left Ear: External ear normal.  Mouth/Throat: Oropharynx is clear and moist.       Bilateral tympanic membranes with normal architecture and good light reflex bilaterally. No trismus.  Eyes: Conjunctivae normal and EOM are normal. Pupils are equal, round, and reactive to light.  Neck: Normal range of motion. Neck supple. No JVD present.       Focal left sided tender LAD  Cardiovascular: Normal rate.   Pulmonary/Chest: Effort normal and breath sounds normal. No stridor. No respiratory distress. She has no wheezes. She has no rales. She exhibits no tenderness.       Good air movement in all fields no wheezing.  Abdominal: Soft. Bowel sounds are normal. She exhibits no distension and no mass. There is no tenderness. There is no rebound and no guarding.  Musculoskeletal: Normal range of motion.  Lymphadenopathy:    She has cervical adenopathy.  Neurological: She is alert and oriented to person, place, and time.  Skin:       Acanthosis nigricans on posterior neck  Psychiatric: She has a normal mood and affect.    ED Course  Procedures (including critical care time)  Labs Reviewed - No data to display Dg Chest 2 View  05/26/2012  *RADIOLOGY REPORT*  Clinical Data: Short of breath and cough  CHEST - 2 VIEW  Comparison: 03/16/2012  Findings: The heart size and mediastinal contours are  within normal limits.  Both lungs are clear.  The visualized skeletal structures are unremarkable.  IMPRESSION: No active cardiopulmonary abnormalities.   Original Report Authenticated By: Signa Kell, M.D.      1. Viral syndrome   2. Asthma exacerbation   3. Lymphadenopathy of right cervical region       MDM  Patient is afebrile with full range of motion to neck and no meningismus: doubt meningitis.   Likely viral syndrome exacerbating asthma   Patient reevaluated after nebulizer treatment with significant subjective improvement, and sounds remained clear to  auscultation vital signs within normal range. Blood work and chest x-ray are clear. Patient is ready for discharge with return precautions given.   Pt verbalized understanding and agrees with care plan. Outpatient follow-up and return precautions given.    New Prescriptions   ALBUTEROL (PROVENTIL) (2.5 MG/3ML) 0.083% NEBULIZER SOLUTION    Take 3 mLs (2.5 mg total) by nebulization every 6 (six) hours as needed for wheezing.   PREDNISONE (DELTASONE) 20 MG TABLET    Take 2 tablets (40 mg total) by mouth daily.        Wynetta Emery, PA-C 05/26/12 2220

## 2012-05-26 NOTE — ED Notes (Signed)
To ED for eval of wheezing and nonproductive cough for past cple days. No fever. Using inhaler without relief. Speaks in clear sentences

## 2012-05-26 NOTE — ED Notes (Signed)
PA at bedside to update patient.

## 2012-05-27 NOTE — ED Provider Notes (Signed)
Medical screening examination/treatment/procedure(s) were performed by non-physician practitioner and as supervising physician I was immediately available for consultation/collaboration.   Jalee Saine M Delman Goshorn, MD 05/27/12 2046 

## 2013-03-11 ENCOUNTER — Encounter (HOSPITAL_COMMUNITY): Payer: Self-pay | Admitting: Emergency Medicine

## 2013-03-11 ENCOUNTER — Emergency Department (HOSPITAL_COMMUNITY)
Admission: EM | Admit: 2013-03-11 | Discharge: 2013-03-11 | Disposition: A | Discharge status: Home / Self Care | Payer: Self-pay | Attending: Emergency Medicine | Admitting: Emergency Medicine

## 2013-03-11 DIAGNOSIS — T161XXA Foreign body in right ear, initial encounter: Secondary | ICD-10-CM

## 2013-03-11 DIAGNOSIS — E669 Obesity, unspecified: Secondary | ICD-10-CM | POA: Insufficient documentation

## 2013-03-11 DIAGNOSIS — J45909 Unspecified asthma, uncomplicated: Secondary | ICD-10-CM | POA: Insufficient documentation

## 2013-03-11 DIAGNOSIS — IMO0002 Reserved for concepts with insufficient information to code with codable children: Secondary | ICD-10-CM | POA: Insufficient documentation

## 2013-03-11 DIAGNOSIS — Y939 Activity, unspecified: Secondary | ICD-10-CM | POA: Insufficient documentation

## 2013-03-11 DIAGNOSIS — T169XXA Foreign body in ear, unspecified ear, initial encounter: Secondary | ICD-10-CM | POA: Insufficient documentation

## 2013-03-11 DIAGNOSIS — Y929 Unspecified place or not applicable: Secondary | ICD-10-CM | POA: Insufficient documentation

## 2013-03-11 NOTE — ED Provider Notes (Signed)
CSN: [id number]     Arrival date & time 03/11/13  0824 History  This chart was scribed for non-physician practitioner Junius Finner, PA-C working with Glynn Octave, MD by Leone Payor, ED Scribe. This patient was seen in room TR08C/TR08C and the patient's care was started at 0824.    Chief Complaint  Patient presents with  . Otalgia    The history is provided by the patient. No language interpreter was used.    HPI Comments: Joan Fisher is a 52 y.o. female who presents to the Emergency Department complaining of a foreign body in the right ear that has been there for about 1 week. Pt states she was cleaning her ear with a Q-tip when part of the cotton became stuck in her ear. She denies any pain, decreased hearing, fever, cough. Has not taken any pain medication or tried to get cotton out of her ear.  Pt states she thought it would fall out on its own.   Past Medical History  Diagnosis Date  . Asthma   . Obesity    Past Surgical History  Procedure Laterality Date  . Shoulder surgery    . Dislocated shoulder repair      r   History reviewed. No pertinent family history. History  Substance Use Topics  . Smoking status: Never Smoker   . Smokeless tobacco: Not on file  . Alcohol Use: Yes     Comment: wine occasional   OB History   Grav Para Term Preterm Abortions TAB SAB Ect Mult Living                 Review of Systems  Constitutional: Negative for fever.  HENT: Negative for ear pain and hearing loss.        Foreign body in right ear  Respiratory: Negative for cough.   All other systems reviewed and are negative.    Allergies  Percocet  Home Medications   Current Outpatient Rx  Name  Route  Sig  Dispense  Refill  . acetaminophen (TYLENOL) 500 MG tablet   Oral   Take 1,000 mg by mouth 2 (two) times daily as needed for pain.          BP 140/78  Pulse 60  Temp(Src) 98.7 F (37.1 C) (Oral)  Resp 18  Ht 5\' 2"  (1.575 m)  Wt 260 lb (117.935 kg)  BMI  47.54 kg/m2  SpO2 98% Physical Exam  Nursing note and vitals reviewed. Constitutional: She is oriented to person, place, and time. She appears well-developed and well-nourished.  HENT:  Head: Normocephalic and atraumatic.  Right Ear: Hearing, tympanic membrane and external ear normal. A foreign body (cotton ) is present.  Left Ear: Hearing, tympanic membrane, external ear and ear canal normal.  Eyes: EOM are normal.  Neck: Normal range of motion.  Cardiovascular: Normal rate.   Pulmonary/Chest: Effort normal.  Musculoskeletal: Normal range of motion.  Neurological: She is alert and oriented to person, place, and time.  Skin: Skin is warm and dry.  Psychiatric: She has a normal mood and affect. Her behavior is normal.    ED Course  Procedures   DIAGNOSTIC STUDIES: Oxygen Saturation is 100% on RA, normal by my interpretation.    COORDINATION OF CARE: 9:19 AM Discussed treatment plan with pt at bedside and pt agreed to plan.   10:25 AM Will try a cerumen remover tool to help remove the residual cotton.   10:33 AM Cerumen remover was unsuccessful. Will try to  remove by irrigation now.   10:48 AM Irrigation was unsuccessful. Advised pt to use warm water and peroxide mixture to rinse the ear canal a couple times per day to help dislodge the piece of cotton.  Discharged pt home.  Return precautions provided. Pt verbalized understanding and agreement with tx plan.  Labs Review Labs Reviewed - No data to display Imaging Review No results found.  EKG Interpretation   None       MDM   1. Foreign body in ear, right, initial encounter    No damage to TM. No evidence of infection.  Small piece of cotton stuck in right ear canal.  Attempted several techniques without success.  Advised pt to use irrigation at home.  F/u with [facility] if continued ear discomfort.   I personally performed the services described in this documentation, which was scribed in my presence.  The recorded information has been reviewed and is accurate.   Junius Finner, PA-C 03/11/13 1554

## 2013-03-11 NOTE — ED Notes (Signed)
Pt reports that she has cotton stuck in her right ear, and has been there for the past week. States that the end of a Q-tip broke off in her ear. Denies any pain.

## 2013-03-11 NOTE — ED Provider Notes (Signed)
Medical screening examination/treatment/procedure(s) were performed by non-physician practitioner and as supervising physician I was immediately available for consultation/collaboration.   Glynn Octave, MD 03/11/13 [phone]

## 2013-04-06 ENCOUNTER — Emergency Department (HOSPITAL_COMMUNITY)
Admission: EM | Admit: 2013-04-06 | Discharge: 2013-04-06 | Disposition: A | Discharge status: Home / Self Care | Payer: Self-pay | Attending: Emergency Medicine | Admitting: Emergency Medicine

## 2013-04-06 ENCOUNTER — Emergency Department (HOSPITAL_COMMUNITY): Payer: Self-pay

## 2013-04-06 ENCOUNTER — Encounter (HOSPITAL_COMMUNITY): Payer: Self-pay | Admitting: Emergency Medicine

## 2013-04-06 DIAGNOSIS — J45901 Unspecified asthma with (acute) exacerbation: Secondary | ICD-10-CM | POA: Insufficient documentation

## 2013-04-06 DIAGNOSIS — R079 Chest pain, unspecified: Secondary | ICD-10-CM

## 2013-04-06 DIAGNOSIS — R0789 Other chest pain: Secondary | ICD-10-CM | POA: Insufficient documentation

## 2013-04-06 DIAGNOSIS — E669 Obesity, unspecified: Secondary | ICD-10-CM | POA: Insufficient documentation

## 2013-04-06 LAB — COMPREHENSIVE METABOLIC PANEL
AST: 24 U/L (ref 0–37)
Albumin: 3.8 g/dL (ref 3.5–5.2)
Chloride: 107 mEq/L (ref 96–112)
Creatinine, Ser: 0.76 mg/dL (ref 0.50–1.10)
Potassium: 4.1 mEq/L (ref 3.5–5.1)
Total Bilirubin: 0.3 mg/dL (ref 0.3–1.2)
Total Protein: 7.6 g/dL (ref 6.0–8.3)

## 2013-04-06 LAB — CBC WITH DIFFERENTIAL/PLATELET
Basophils Absolute: 0 10*3/uL (ref 0.0–0.1)
Basophils Relative: 0 % (ref 0–1)
MCHC: 33 g/dL (ref 30.0–36.0)
Monocytes Absolute: 0.5 10*3/uL (ref 0.1–1.0)
Neutro Abs: 2.6 10*3/uL (ref 1.7–7.7)
Neutrophils Relative %: 54 % (ref 43–77)
RDW: 13.6 % (ref 11.5–15.5)

## 2013-04-06 NOTE — Discharge Planning (Signed)
[facility]- Community Liaison  Follow up appointment was made for Monday Apr 18, 2013 at 4:00 with the [facility]. Patient will be establishing primary care as well as obtaining the orange card during this visit. Patient is aware of the appointments, application and my contact information was given for any questions or concerns.

## 2013-04-06 NOTE — ED Notes (Signed)
Pt c/o CP with some diaphoresis and SOB starting this am; pt sts pain worse with exertion

## 2013-04-06 NOTE — ED Notes (Signed)
Dr. Delo at bedside. 

## 2013-04-06 NOTE — ED Provider Notes (Signed)
CSN: [id number]     Arrival date & time 04/06/13  2595 History   First MD Initiated Contact with Patient 04/06/13 0802     Chief Complaint  Patient presents with  . Chest Pain   (Consider location/radiation/quality/duration/timing/severity/associated sxs/prior Treatment) HPI Comments: Patient is a 52 year old female with past medical history of asthma. She presents today with complaints of tightness in her chest that started at about 5 AM. She states that she woke up this morning and was getting ready to start her day when she developed tightness in the center of her chest. This lasted for about 5 minutes and resolved. She reports feeling short of breath and maybe somewhat sweaty. She denies any fevers or cough. She denies any recent exertional symptoms and has never had any cardiac history. She has no history of smoking, diabetes, hypertension, hypercholesterolemia, but does state that her mother had heart issues in her 93s.  Patient is a 52 y.o. female presenting with chest pain. The history is provided by the patient.  Chest Pain Pain location:  Substernal area Pain quality: tightness   Pain radiates to:  Does not radiate Pain radiates to the back: no   Pain severity:  Mild Onset quality:  Sudden Duration:  5 minutes Timing:  Constant Progression:  Resolved Chronicity:  New Relieved by:  Nothing Worsened by:  Nothing tried Ineffective treatments:  None tried   Past Medical History  Diagnosis Date  . Asthma   . Obesity    Past Surgical History  Procedure Laterality Date  . Shoulder surgery    . Dislocated shoulder repair      r   History reviewed. No pertinent family history. History  Substance Use Topics  . Smoking status: Never Smoker   . Smokeless tobacco: Not on file  . Alcohol Use: Yes     Comment: wine occasional   OB History   Grav Para Term Preterm Abortions TAB SAB Ect Mult Living                 Review of Systems  Cardiovascular: Positive for chest  pain.  All other systems reviewed and are negative.    Allergies  Percocet  Home Medications   Current Outpatient Rx  Name  Route  Sig  Dispense  Refill  . acetaminophen (TYLENOL) 500 MG tablet   Oral   Take 1,000 mg by mouth 2 (two) times daily as needed for pain.          BP 150/64  Pulse 80  Temp(Src) 98.2 F (36.8 C) (Oral)  Resp 18  SpO2 99% Physical Exam  Nursing note and vitals reviewed. Constitutional: She is oriented to person, place, and time. She appears well-developed and well-nourished. No distress.  Patient is an obese female in no acute distress. She is awake alert and appropriate.  HENT:  Head: Normocephalic and atraumatic.  Neck: Normal range of motion. Neck supple.  Cardiovascular: Normal rate and regular rhythm.  Exam reveals no gallop and no friction rub.   No murmur heard. Pulmonary/Chest: Effort normal and breath sounds normal. No respiratory distress. She has no wheezes.  Abdominal: Soft. Bowel sounds are normal. She exhibits no distension. There is no tenderness.  Musculoskeletal: Normal range of motion.  Neurological: She is alert and oriented to person, place, and time.  Skin: Skin is warm and dry. She is not diaphoretic.    ED Course  Procedures (including critical care time) Labs Review Labs Reviewed - No data to display Imaging  Review No results found.  EKG Interpretation     Ventricular Rate:  69 PR Interval:  188 QRS Duration: 84 QT Interval:  402 QTC Calculation: 430 R Axis:   37 Text Interpretation:  Normal sinus rhythm with sinus arrhythmia Normal ECG            MDM  No diagnosis found. Patient is a 52 year old female with no cardiac risk factors. She presents today for evaluation several hours after a five-minute episode of tightness in her chest. This subsided spontaneously and she has had no further episodes since that time. Her workup is unremarkable including a normal EKG and negative troponin several hours  after the onset of her symptoms. She has had no further episodes while in the emergency department and states that she is feeling just fine. I do not feel as though admission is indicated at this time. If she has further episodes or if she desires cardiology followup, I will provide the information for [facility] cardiology to arrange a followup appointment to discuss a possible stress test. If her symptoms return or significantly worsen she is to return to the ER to be reevaluated    Geoffery Lyons, MD 04/06/13 1121

## 2013-04-06 NOTE — ED Notes (Signed)
Family at bedside. 

## 2013-04-18 ENCOUNTER — Ambulatory Visit: Payer: Self-pay | Admitting: Internal Medicine

## 2013-04-18 ENCOUNTER — Ambulatory Visit: Payer: Self-pay

## 2013-05-09 ENCOUNTER — Ambulatory Visit: Payer: Self-pay

## 2013-05-09 ENCOUNTER — Ambulatory Visit: Payer: Self-pay | Admitting: Internal Medicine

## 2013-06-08 ENCOUNTER — Ambulatory Visit: Payer: Self-pay

## 2013-08-17 ENCOUNTER — Emergency Department (HOSPITAL_COMMUNITY)
Admission: EM | Admit: 2013-08-17 | Discharge: 2013-08-17 | Disposition: A | Discharge status: Home / Self Care | Payer: Self-pay | Attending: Emergency Medicine | Admitting: Emergency Medicine

## 2013-08-17 ENCOUNTER — Encounter (HOSPITAL_COMMUNITY): Payer: Self-pay | Admitting: Emergency Medicine

## 2013-08-17 DIAGNOSIS — M1990 Unspecified osteoarthritis, unspecified site: Secondary | ICD-10-CM

## 2013-08-17 DIAGNOSIS — J45909 Unspecified asthma, uncomplicated: Secondary | ICD-10-CM | POA: Insufficient documentation

## 2013-08-17 DIAGNOSIS — M129 Arthropathy, unspecified: Secondary | ICD-10-CM | POA: Insufficient documentation

## 2013-08-17 MED ORDER — IBUPROFEN 800 MG PO TABS: 800.0000 mg | ORAL_TABLET | Freq: Three times a day (TID) | ORAL | Status: DC

## 2013-08-17 MED ORDER — METHOCARBAMOL 500 MG PO TABS: 500.0000 mg | ORAL_TABLET | Freq: Two times a day (BID) | ORAL | Status: DC

## 2013-08-17 NOTE — ED Notes (Signed)
Pt reporting bilateral arm and knee pain for several weeks. States aching in joints. Denies injury. Pt is a x 4. In NAd

## 2013-08-17 NOTE — Discharge Instructions (Signed)
Osteoarthritis Osteoarthritis is a disease that causes soreness and swelling (inflammation) of a joint. It occurs when the cartilage at the affected joint wears down. Cartilage acts as a cushion, covering the ends of bones where they meet to form a joint. Osteoarthritis is the most common form of arthritis. It often occurs in older people. The joints affected most often by this condition include those in the:  Ends of the fingers.  Thumbs.  Neck.  Lower back.  Knees.  Hips. CAUSES  Over time, the cartilage that covers the ends of bones begins to wear away. This causes bone to rub on bone, producing pain and stiffness in the affected joints.  RISK FACTORS Certain factors can increase your chances of having osteoarthritis, including:  Older age.  Excessive body weight.  Overuse of joints. SIGNS AND SYMPTOMS   Pain, swelling, and stiffness in the joint.  Over time, the joint may lose its normal shape.  Small deposits of bone (osteophytes) may grow on the edges of the joint.  Bits of bone or cartilage can break off and float inside the joint space. This may cause more pain and damage. DIAGNOSIS  Your health care provider will do a physical exam and ask about your symptoms. Various tests may be ordered, such as:  X-rays of the affected joint.  An MRI scan.  Blood tests to rule out other types of arthritis.  Joint fluid tests. This involves using a needle to draw fluid from the joint and examining the fluid under a microscope. TREATMENT  Goals of treatment are to control pain and improve joint function. Treatment plans may include:  A prescribed exercise program that allows for rest and joint relief.  A weight control plan.  Pain relief techniques, such as:  Properly applied heat and cold.  Electric pulses delivered to nerve endings under the skin (transcutaneous electrical nerve stimulation, TENS).  Massage.  Certain nutritional supplements.  Medicines to  control pain, such as:  Acetaminophen.  Nonsteroidal anti-inflammatory drugs (NSAIDs), such as naproxen.  Narcotic or central-acting agents, such as tramadol.  Corticosteroids. These can be given orally or as an injection.  Surgery to reposition the bones and relieve pain (osteotomy) or to remove loose pieces of bone and cartilage. Joint replacement may be needed in advanced states of osteoarthritis. HOME CARE INSTRUCTIONS   Only take over-the-counter or prescription medicines as directed by your health care provider. Take all medicines exactly as instructed.  Maintain a healthy weight. Follow your health care provider's instructions for weight control. This may include dietary instructions.  Exercise as directed. Your health care provider can recommend specific types of exercise. These may include:  Strengthening exercises These are done to strengthen the muscles that support joints affected by arthritis. They can be performed with weights or with exercise bands to add resistance.  Aerobic activities These are exercises, such as brisk walking or low-impact aerobics, that get your heart pumping.  Range-of-motion activities These keep your joints limber.  Balance and agility exercises These help you maintain daily living skills.  Rest your affected joints as directed by your health care provider.  Follow up with your health care provider as directed. SEEK MEDICAL CARE IF:   Your skin turns red.  You develop a rash in addition to your joint pain.  You have worsening joint pain. SEEK IMMEDIATE MEDICAL CARE IF:  You have a significant loss of weight or appetite.  You have a fever along with joint or muscle aches.  You have  night sweats. FOR MORE INFORMATION  National Institute of Arthritis and Musculoskeletal and Skin Diseases: www.niams.http://www.myers.net/nih.gov [employer] on Aging: https://walker.com/www.nia.nih.gov American College of Rheumatology: www.rheumatology.org Document Released: 05/12/2005  Document Revised: 03/02/2013 Document Reviewed: 01/17/2013 [facility] Patient Information 2014 [place], [place].   Emergency Department Resource Guide 1) Find a Doctor and Pay Out of Pocket Although you won't have to find out who is covered by your insurance plan, it is a good idea to ask around and get recommendations. You will then need to call the office and see if the doctor you have chosen will accept you as a new patient and what types of options they offer for patients who are self-pay. Some doctors offer discounts or will set up payment plans for their patients who do not have insurance, but you will need to ask so you aren't surprised when you get to your appointment.  2) Contact Your Local Health Department Not all health departments have doctors that can see patients for sick visits, but many do, so it is worth a call to see if yours does. If you don't know where your local health department is, you can check in your phone book. The CDC also has a tool to help you locate your state's health department, and many state websites also have listings of all of their local health departments.  3) Find a Walk-in Clinic If your illness is not likely to be very severe or complicated, you may want to try a walk in clinic. These are popping up all over the country in pharmacies, drugstores, and shopping centers. They're usually staffed by nurse practitioners or physician assistants that have been trained to treat common illnesses and complaints. They're usually fairly quick and inexpensive. However, if you have serious medical issues or chronic medical problems, these are probably not your best option.  No Primary Care Doctor: - Call Health Connect at  [phone] - they can help you locate a primary care doctor that  accepts your insurance, provides certain services, etc. - Physician Referral Service- [phone]  Chronic Pain Problems: Organization         Address  Phone   Notes  [facility]  [phone] Patients need to be referred by their primary care doctor.   Medication Assistance: Organization         Address  Phone   Notes  [facility] Medication [facility] [address] [place], [place] [address] [phone] --Must be a resident of [place] -- Must have NO insurance coverage whatsoever (no Medicaid/ Medicare, etc.) -- The pt. MUST have a primary care doctor that directs their care regularly and follows them in the community   MedAssist  [phone]   [employer]  [phone]    Agencies that provide inexpensive medical care: Organization         Address  Phone   Notes  [facility]  [phone]   [facility]    [phone]   [facility] [address] [place], [place] [address] [phone]   [facility] [address], [place] [phone]   [facility]anned [facility]    [phone]   [facility]    [phone]   [facility]  [address], [place] Phone:  [phone], Fax:  [phone] Hours of Operation:  9 am - 6 pm, M-F.  Also accepts  Medicaid/Medicare and self-pay.  [facility]  [address], [place] Phone: [phone], Fax: [phone]. Hours of Operation:  8:30 am - 5:30 pm, M-F.  Also accepts Medicaid and self-pay.  [facility] [place] [address] [place] Phone: [phone]   [facility] [address] [place]asha [place], [place] [phone], Ext. [phone] Mondays & Thursdays: 7-9 AM.  First 15 patients are seen on a first come, first serve basis.    Medicaid-accepting [facility] Providers:  Organization         Address  Phone   Notes  [facility] [address], [place] [phone] Also accepts self-pay patients.  [facility] [address]  Laurell Josephs [place], [place]  [phone]   [facility] [address], [place] [phone]   [facility] [address] [place] [phone]   Renaye Rakers [address], [place]   [phone] Only accepts [place] Access [place] patients after they have their name applied to their card.   Self-Pay (no insurance) in [facility]:  Organization         Address  Phone   Notes  Sickle Cell Patients, [facility] [address] [place], [place] [phone]   [facility] [address] [place], [place] [phone]   [facility] [place]  [address], [place] [phone]   [facility]/Dr. Osei-Bonsu  [address], [place] or [address], [place] [phone] Phone number for both [place] and [place] locations is the same.  Urgent Medical and [facility] [address], [place] [phone]   [facility] [address], [place] or [address] [phone]   [facility] [address] [place] [phone], phone; [phone], fax Sees patients 1st and 3rd Saturday of every month.  Must not qualify for public or private insurance (i.e. Medicaid, Medicare, [place] Health Choice, Veterans' Benefits)  Household income should be no more than 200% of the poverty level The clinic cannot treat you if you are pregnant or think you are pregnant  Sexually transmitted diseases are not treated at the clinic.    Dental Care: Organization         Address  Phone  Notes  [facility] [address] [place], [place] [phone] Accepts children up to age 69 who are enrolled in [place] or [place] Health Choice; pregnant women with a Medicaid card; and children who have applied for Medicaid  or [place] Health Choice, but were declined, whose parents can pay a reduced fee at time of service.  [facility]  [address], [place] [phone] Accepts children up to age 75 who are enrolled in [place] or [place] Health Choice; pregnant women with a Medicaid card; and children who have applied for Medicaid or [place] Health Choice, but were declined, whose parents can pay a reduced fee at time of service.  [facility]  [address] [place], [place] [phone] Patients are seen by appointment only. Walk-ins are not accepted. [facility] will see patients 55 years of age and older. Monday - Tuesday (8am-5pm) Most  Wednesdays (8:30-5pm) $30 per visit, cash only  [facility] Adult Dental Access PROGRAM  [address], [facility] [phone] Patients are seen by appointment only. Walk-ins are not accepted. [facility] will see patients 33 years of age and older. One Wednesday Evening (Monthly: Volunteer Based).  $30 per visit, cash only  [employer]  [phone] for adults; Children under age 68, call Graduate Pediatric Dentistry at [phone]. Children aged 59-14, please call [phone] to request a pediatric application.  Dental services are provided in all areas of dental care including fillings, crowns and bridges, complete and partial dentures, implants, gum treatment, root canals, and extractions. Preventive care is also provided. Treatment is provided to both adults and children. Patients are selected via a lottery and there is often a waiting list.   [facility] [address], [place]  [phone] [email or web address]   [facility] [address] [place], [place] [phone], Ext. [phone] Second and Fourth Thursday of each month, opens at 6:30 AM; Clinic ends at 9 AM.  Patients are seen on a first-come first-served basis, and a limited number are seen  during each clinic.   [facility]  [address] Ether Griffins [place], [place] [phone]   Eligibility Requirements You must have lived in [place], [place], or [place] counties for at least the last three months.   You cannot be eligible for state or federal sponsored [place], including [employer], [place], or [employer].   You generally cannot be eligible for healthcare insurance through your employer.    How to apply: Eligibility screenings are held every Tuesday and Wednesday afternoon from 1:00 pm until 4:00 pm. You do not need an appointment for the interview!  [facility] [address] [place], [place] [phone]   [facility] Health Department  [phone]   [facility] Health Department  [phone]   [facility] Health Department  [phone]    Behavioral Health Resources in the Community: Intensive Outpatient Programs Organization         Address  Phone  Notes  [facility] Services [address] [place], [place] [phone]   [facility] [address] [place], [place] [phone]   [facility] [address], [place], [place]  [phone]   [facility] Mental Health [address],  [place], [place] [phone] or [phone]   Substance Abuse Resources Organization         Address  Phone  Notes  [facility] Drug Services  [phone]   [facility]  [phone]   [facility]  [phone]   Floydene Flock  [phone]   Residential & Outpatient Substance Abuse Program  [phone]   Psychological Services Organization         Address  Phone  Notes  [facility] Health  [phone]   [facility] Services  [phone]   [facility] [address], [place] [phone] or [phone]    Mobile Crisis Teams Organization         Address  Phone  Notes  [facility],  Mobile Crisis Care Unit  [phone]   [facility]  [address] [place], [place] [phone]   Doristine Locks [address] [place] [phone]    Self-Help/Support Groups Organization         Address  Phone  Notes  [facility] of [place] - variety of support groups  336- I7437963 Call for more information  Narcotics Anonymous (NA), [facility] [address], [employer] [place]  2 meetings at this location   Statistician         Address  Phone  Notes  [facility] [address],    [place]  [phone]   [facility]  [address], [place] [address], [place], [place] [phone]   [facility] [address] [place], [place] [phone] Admissions: 8am-3pm M-F  [facility] [address]    [place], [place] [phone]   [facility] [address] [place], [place], [place] [phone]   [facility] [address]  [place], [place] [phone]   [facility] - Intensive Outpatient [address] Laurell Josephs [address], [place], [place] [phone]   [facility] ([facility] [address] [place].,  [place], [place] [phone] or [phone]   [facility] ([facility]) [address] [place], [place] [phone] Accepts Medicaid  [facility] [address],  [place] [phone] Substance Abuse/Addiction Treatment   [facility] [place] Organization         Address  Phone  Notes  [facility]  [phone]   Angie Fava, PhD [address] Ervin Knack [place], [place]   [phone] or [phone]   [facility] Behavioral   [address] [place], [place] [phone]   [facility] [address], [place], [place] [phone] Insurance/Medicaid/sponsorship through [facility] and Families [address]                                    [place], [place] [phone] Therapy/tele-psych/case  [facility] [address] [place] [address], [place] [phone]    Dr. Lolly Mustache  [phone]   [facility] [place]  United Way [facility] Dept. 1) [address] [place] 2) [address], [place] 3)  [address], [place] [phone]  [phone]   [facility] Child Abuse Hotline (340) 230-6804 or [phone] (After Hours)

## 2013-08-17 NOTE — ED Provider Notes (Signed)
CSN: [id number]     Arrival date & time 08/17/13  1433 History  This chart was scribed for Fayrene Helper PA-C, a non-physician practitioner working with Junius Argyle, MD by Lewanda Rife, ED Scribe. This patient was seen in room TR10C/TR10C and the patient's care was started at 3:02 PM        Chief Complaint  Patient presents with  . Joint Pain     (Consider location/radiation/quality/duration/timing/severity/associated sxs/prior Treatment) The history is provided by the patient. No language interpreter was used.   HPI Comments: Joan Fisher is a 53 y.o. female who presents to the Emergency Department complaining of constant bilateral arm and knee pain onset 2 months. States pain is worse today. States she is working more often and lifting more objects at work. Describes pain as throbbing and pulling. Reports associated bilateral shoulder arthralgias, bilateral calf cramping, and antalgic gait. States she drinks plenty of fluids. Reports taking tylenol and motrin with mild relief of symptoms. Reports pain is exacerbated by standing for a long period of time. Denies associated recent injury/trauma, fever, chills, rash, numbness, and change in ROM. Denies recent long travel or extended immobility, prior history of DVT or PE, unilateral leg swelling or pain, hemoptysis, active cancer, or estrogen (birth control) use.    Past Medical History  Diagnosis Date  . Asthma   . Obesity    Past Surgical History  Procedure Laterality Date  . Shoulder surgery    . Dislocated shoulder repair      r   No family history on file. History  Substance Use Topics  . Smoking status: Never Smoker   . Smokeless tobacco: Not on file  . Alcohol Use: Yes     Comment: wine occasional   OB History   Grav Para Term Preterm Abortions TAB SAB Ect Mult Living                 Review of Systems  Constitutional: Negative for fever.  Musculoskeletal: Positive for arthralgias.   Psychiatric/Behavioral: Negative for confusion.      Allergies  Percocet  Home Medications   Current Outpatient Rx  Name  Route  Sig  Dispense  Refill  . acetaminophen (TYLENOL) 500 MG tablet   Oral   Take 1,000 mg by mouth 2 (two) times daily as needed for pain.         . Multiple Vitamins-Minerals (MULTIVITAMIN PO)   Oral   Take 1 tablet by mouth daily.          BP 142/74  Pulse 73  Temp(Src) 97.9 F (36.6 C) (Oral)  Resp 16  Ht 5\' 2"  (1.575 m)  Wt 265 lb (120.203 kg)  BMI 48.46 kg/m2  SpO2 100% Physical Exam  Nursing note and vitals reviewed. Constitutional: She is oriented to person, place, and time. She appears well-developed and well-nourished. No distress.  Pt is morbidly obese   HENT:  Head: Normocephalic and atraumatic.  Eyes: EOM are normal.  Neck: Neck supple. No tracheal deviation present.  Cardiovascular: Normal rate, intact distal pulses and normal pulses.   Pulses:      Dorsalis pedis pulses are 2+ on the right side, and 2+ on the left side.       Posterior tibial pulses are 2+ on the right side, and 2+ on the left side.  Pulmonary/Chest: Effort normal. No respiratory distress.  Musculoskeletal: Normal range of motion.  Normal ROM of all joints. Bilateral shoulder, elbows, and knee subjective aching sensation  Neurological: She is alert and oriented to person, place, and time.  Skin: Skin is warm and dry.  Psychiatric: She has a normal mood and affect. Her behavior is normal.    ED Course  Procedures  COORDINATION OF CARE:  Nursing notes reviewed. Vital signs reviewed. Initial pt interview and examination performed.   3:26 PM-Discussed treatment plan with pt at bedside. Pt agrees with plan.  Pt is morbidly obese, having polyarthalgias likely 2/2 arthritis.  No evidence to suggest septic arthritis.  Polymyalgia rheumatica considered.  Recommend RICE therapy, will provide pain management, ortho referral.     Treatment plan  initiated:Medications - No data to display   Initial diagnostic testing ordered.     Labs Review Labs Reviewed - No data to display Imaging Review No results found.   EKG Interpretation None      MDM   Final diagnoses:  Osteoarthritis    BP 125/57  Pulse 66  Temp(Src) 97.7 F (36.5 C) (Oral)  Resp 18  Ht 5\' 2"  (1.575 m)  Wt 265 lb (120.203 kg)  BMI 48.46 kg/m2  SpO2 99%   I personally performed the services described in this documentation, which was scribed in my presence. The recorded information has been reviewed and is accurate.     Fayrene HelperBowie Yamila Cragin, PA-C 08/17/13 1605

## 2013-08-17 NOTE — ED Notes (Addendum)
Pt c/o joint pain x 82mo. Pain in both knees, more in the left knee.  Pain in shoulders when lifting arms, more in left shoulder.  Pt reports mild lower back pain as well.  Left knee hurts the most out of all joints.  Pain is constant and worsens with activity.  Pt describes pain as aching.  Pt rates pain 6/10, but 9/10 with activity.  Denies current illness.  Reports taking tylenol to tx pain.  Has not tried ice or heat.  Nad, resp e/u, skin warm and dry.

## 2013-08-18 NOTE — ED Provider Notes (Signed)
Medical screening examination/treatment/procedure(s) were performed by non-physician practitioner and as supervising physician I was immediately available for consultation/collaboration.   EKG Interpretation None        Aaden Buckman S Zyiere Rosemond, MD 08/18/13 0018 

## 2013-09-01 ENCOUNTER — Ambulatory Visit: Payer: Self-pay

## 2013-09-13 ENCOUNTER — Emergency Department (HOSPITAL_COMMUNITY)
Admission: EM | Admit: 2013-09-13 | Discharge: 2013-09-13 | Disposition: A | Discharge status: Home / Self Care | Payer: Self-pay | Attending: Emergency Medicine | Admitting: Emergency Medicine

## 2013-09-13 ENCOUNTER — Encounter (HOSPITAL_COMMUNITY): Payer: Self-pay | Admitting: Emergency Medicine

## 2013-09-13 DIAGNOSIS — M79652 Pain in left thigh: Secondary | ICD-10-CM

## 2013-09-13 DIAGNOSIS — M79651 Pain in right thigh: Secondary | ICD-10-CM

## 2013-09-13 DIAGNOSIS — J45909 Unspecified asthma, uncomplicated: Secondary | ICD-10-CM | POA: Insufficient documentation

## 2013-09-13 DIAGNOSIS — Z79899 Other long term (current) drug therapy: Secondary | ICD-10-CM | POA: Insufficient documentation

## 2013-09-13 DIAGNOSIS — E669 Obesity, unspecified: Secondary | ICD-10-CM | POA: Insufficient documentation

## 2013-09-13 DIAGNOSIS — J020 Streptococcal pharyngitis: Secondary | ICD-10-CM | POA: Insufficient documentation

## 2013-09-13 DIAGNOSIS — Z885 Allergy status to narcotic agent status: Secondary | ICD-10-CM | POA: Insufficient documentation

## 2013-09-13 LAB — RAPID STREP SCREEN (MED CTR MEBANE ONLY): Streptococcus, Group A Screen (Direct): POSITIVE — AB

## 2013-09-13 MED ORDER — ALBUTEROL SULFATE HFA 108 (90 BASE) MCG/ACT IN AERS: 1.0000 | INHALATION_SPRAY | Freq: Four times a day (QID) | RESPIRATORY_TRACT | Status: DC | PRN

## 2013-09-13 MED ORDER — PENICILLIN G BENZATHINE 1200000 UNIT/2ML IM SUSP
1.2000 10*6.[IU] | Freq: Once | INTRAMUSCULAR | Status: AC
  Administered 2013-09-13: 1.2 10*6.[IU] via INTRAMUSCULAR
  Filled 2013-09-13: qty 2

## 2013-09-13 MED ORDER — ACETAMINOPHEN 325 MG PO TABS
650.0000 mg | ORAL_TABLET | Freq: Once | ORAL | Status: AC
  Administered 2013-09-13: 650 mg via ORAL
  Filled 2013-09-13: qty 2

## 2013-09-13 NOTE — ED Notes (Signed)
Pt reports bilateral pain in thighs. Also reports sore throat since last night.

## 2013-09-13 NOTE — Discharge Instructions (Signed)
Drink plenty of fluids and rest  Take Ibuprofen or Tylenol for pain control  Use RICE method - see below for further evaluation and management  Follow-up with your doctor for further re-fills of your albuterol - or continued leg pain Return to the emergency department if you develop any changing/worsening condition, difficulty swallowing/breathing, fever, worsening pain, leg swelling, weakness, joint swelling, loss of sensation,  or any other concerns (please read additional information regarding your condition below)    Strep Throat Strep throat is an infection of the throat caused by a bacteria named Streptococcus pyogenes. Your caregiver may call the infection streptococcal "tonsillitis" or "pharyngitis" depending on whether there are signs of inflammation in the tonsils or back of the throat. Strep throat is most common in children aged 5 15 years during the cold months of the year, but it can occur in people of any age during any season. This infection is spread from person to person (contagious) through coughing, sneezing, or other close contact. SYMPTOMS   Fever or chills.  Painful, swollen, red tonsils or throat.  Pain or difficulty when swallowing.  White or yellow spots on the tonsils or throat.  Swollen, tender lymph nodes or "glands" of the neck or under the jaw.  Red rash all over the body (rare). DIAGNOSIS  Many different infections can cause the same symptoms. A test must be done to confirm the diagnosis so the right treatment can be given. A "rapid strep test" can help your caregiver make the diagnosis in a few minutes. If this test is not available, a light swab of the infected area can be used for a throat culture test. If a throat culture test is done, results are usually available in a day or two. TREATMENT  Strep throat is treated with antibiotic medicine. HOME CARE INSTRUCTIONS   Gargle with 1 tsp of salt in 1 cup of warm water, 3 4 times per day or as needed for  comfort.  Family members who also have a sore throat or fever should be tested for strep throat and treated with antibiotics if they have the strep infection.  Make sure everyone in your household washes their hands well.  Do not share food, drinking cups, or personal items that could cause the infection to spread to others.  You may need to eat a soft food diet until your sore throat gets better.  Drink enough water and fluids to keep your urine clear or pale yellow. This will help prevent dehydration.  Get plenty of rest.  Stay home from school, daycare, or work until you have been on antibiotics for 24 hours.  Only take over-the-counter or prescription medicines for pain, discomfort, or fever as directed by your caregiver.  If antibiotics are prescribed, take them as directed. Finish them even if you start to feel better. SEEK MEDICAL CARE IF:   The glands in your neck continue to enlarge.  You develop a rash, cough, or earache.  You cough up green, yellow-brown, or bloody sputum.  You have pain or discomfort not controlled by medicines.  Your problems seem to be getting worse rather than better. SEEK IMMEDIATE MEDICAL CARE IF:   You develop any new symptoms such as vomiting, severe headache, stiff or painful neck, chest pain, shortness of breath, or trouble swallowing.  You develop severe throat pain, drooling, or changes in your voice.  You develop swelling of the neck, or the skin on the neck becomes red and tender.  You have a  fever.  You develop signs of dehydration, such as fatigue, dry mouth, and decreased urination.  You become increasingly sleepy, or you cannot wake up completely. Document Released: 05/09/2000 Document Revised: 04/28/2012 Document Reviewed: 07/11/2010 [facility] Patient Information 2014 [place], [place].  Musculoskeletal Pain Musculoskeletal pain is muscle and boney aches and pains. These pains can occur in any part of the body. Your caregiver  may treat you without knowing the cause of the pain. They may treat you if blood or urine tests, X-rays, and other tests were normal.  CAUSES There is often not a definite cause or reason for these pains. These pains may be caused by a type of germ (virus). The discomfort may also come from overuse. Overuse includes working out too hard when your body is not fit. Boney aches also come from weather changes. Bone is sensitive to atmospheric pressure changes. HOME CARE INSTRUCTIONS   Ask when your test results will be ready. Make sure you get your test results.  Only take over-the-counter or prescription medicines for pain, discomfort, or fever as directed by your caregiver. If you were given medications for your condition, do not drive, operate machinery or power tools, or sign legal documents for 24 hours. Do not drink alcohol. Do not take sleeping pills or other medications that may interfere with treatment.  Continue all activities unless the activities cause more pain. When the pain lessens, slowly resume normal activities. Gradually increase the intensity and duration of the activities or exercise.  During periods of severe pain, bed rest may be helpful. Lay or sit in any position that is comfortable.  Putting ice on the injured area.  Put ice in a bag.  Place a towel between your skin and the bag.  Leave the ice on for 15 to 20 minutes, 3 to 4 times a day.  Follow up with your caregiver for continued problems and no reason can be found for the pain. If the pain becomes worse or does not go away, it may be necessary to repeat tests or do additional testing. Your caregiver may need to look further for a possible cause. SEEK IMMEDIATE MEDICAL CARE IF:  You have pain that is getting worse and is not relieved by medications.  You develop chest pain that is associated with shortness or breath, sweating, feeling sick to your stomach (nauseous), or throw up (vomit).  Your pain becomes  localized to the abdomen.  You develop any new symptoms that seem different or that concern you. MAKE SURE YOU:   Understand these instructions.  Will watch your condition.  Will get help right away if you are not doing well or get worse. Document Released: 05/12/2005 Document Revised: 08/04/2011 Document Reviewed: 01/14/2013 [facility] Patient Information 2014 [place]Care, [place].  RICE: Routine Care for Injuries The routine care of many injuries includes Rest, Ice, Compression, and Elevation (RICE). HOME CARE INSTRUCTIONS  Rest is needed to allow your body to heal. Routine activities can usually be resumed when comfortable. Injured tendons and bones can take up to 6 weeks to heal. Tendons are the cord-like structures that attach muscle to bone.  Ice following an injury helps keep the swelling down and reduces pain.  Put ice in a plastic bag.  Place a towel between your skin and the bag.  Leave the ice on for 15-20 minutes, 03-04 times a day. Do this while awake, for the first 24 to 48 hours. After that, continue as directed by your caregiver.  Compression helps keep swelling down.  It also gives support and helps with discomfort. If an elastic bandage has been applied, it should be removed and reapplied every 3 to 4 hours. It should not be applied tightly, but firmly enough to keep swelling down. Watch fingers or toes for swelling, bluish discoloration, coldness, numbness, or excessive pain. If any of these problems occur, remove the bandage and reapply loosely. Contact your caregiver if these problems continue.  Elevation helps reduce swelling and decreases pain. With extremities, such as the arms, hands, legs, and feet, the injured area should be placed near or above the level of the heart, if possible. SEEK IMMEDIATE MEDICAL CARE IF:  You have persistent pain and swelling.  You develop redness, numbness, or unexpected weakness.  Your symptoms are getting worse rather than improving  after several days. These symptoms may indicate that further evaluation or further X-rays are needed. Sometimes, X-rays may not show a small broken bone (fracture) until 1 week or 10 days later. Make a follow-up appointment with your caregiver. Ask when your X-ray results will be ready. Make sure you get your X-ray results. Document Released: 08/24/2000 Document Revised: 08/04/2011 Document Reviewed: 10/11/2010 [facility] Patient Information 2014 [place], [place].   Emergency Department Resource Guide 1) Find a Doctor and Pay Out of Pocket Although you won't have to find out who is covered by your insurance plan, it is a good idea to ask around and get recommendations. You will then need to call the office and see if the doctor you have chosen will accept you as a new patient and what types of options they offer for patients who are self-pay. Some doctors offer discounts or will set up payment plans for their patients who do not have insurance, but you will need to ask so you aren't surprised when you get to your appointment.  2) Contact Your Local Health Department Not all health departments have doctors that can see patients for sick visits, but many do, so it is worth a call to see if yours does. If you don't know where your local health department is, you can check in your phone book. The CDC also has a tool to help you locate your state's health department, and many state websites also have listings of all of their local health departments.  3) Find a Walk-in Clinic If your illness is not likely to be very severe or complicated, you may want to try a walk in clinic. These are popping up all over the country in pharmacies, drugstores, and shopping centers. They're usually staffed by nurse practitioners or physician assistants that have been trained to treat common illnesses and complaints. They're usually fairly quick and inexpensive. However, if you have serious medical issues or chronic medical  problems, these are probably not your best option.  No Primary Care Doctor: - Call Health Connect at  [phone] - they can help you locate a primary care doctor that  accepts your insurance, provides certain services, etc. - Physician Referral Service- [phone]  Chronic Pain Problems: Organization         Address  Phone   Notes  [facility]  [phone] Patients need to be referred by their primary care doctor.   Medication Assistance: Organization         Address  Phone   Notes  [facility] Medication [facility] [address] [place] [place], [place] [address] [phone] --Must be a resident of [facility] -- Must have NO insurance coverage  whatsoever (no Medicaid/ Medicare, etc.) -- The pt. MUST have a primary care doctor that directs their care regularly and follows them in the community   [facility]  [phone]   [employer]  [phone]    Agencies that provide inexpensive medical care: Organization         Address  Phone   Notes  [facility]  [phone]   [facility]    [phone]   [facility] [address] [place], [place] [address] [phone]   [facility] [address] [place] [place] [phone]   Planned Parenthood    [phone]   [facility]    [phone]   [facility]  [address], [place] Phone:  [phone], Fax:  [phone] Hours of Operation:  9 am - 6 pm, M-F.  Also accepts Medicaid/Medicare and self-pay.  [facility]  [address], [place] Phone: [phone], Fax: [phone]. Hours of Operation:  8:30 am - 5:30 pm, M-F.  Also accepts Medicaid and self-pay.  [facility] [place] [address], [place] Phone: [phone]   [facility] [address] Natasha Bence [place], [place] [phone], Ext. [phone]  Mondays & Thursdays: 7-9 AM.  First 15 patients are seen on a first come, first serve basis.    Medicaid-accepting [facility] Providers:  Organization         Address  Phone   Notes  [facility] [address], [place] [phone] Also accepts self-pay patients.  [facility] [address] Laurell Josephs [place], [place]  [phone]   [facility] [address], [place] [phone]   [facility] [address], [place] [phone]   Renaye Rakers [address], [place]   [phone] Only accepts [place] Access [place] patients after they have their name applied to their card.   Self-Pay (no insurance) in [facility]:  Organization         Address  Phone   Notes  Sickle Cell Patients, [facility] [address] [place], [place] [phone]   [facility] [address] [place], [place] [phone]   [facility] [place]  [address], [place] [phone]   [facility]/Dr. Osei-Bonsu  [address], [place] or [address], [place] [phone] Phone number for both [place] and [place] locations is the same.  [facility]gent [facility] and [facility] [address], [place] [phone]   [facility] [address], [place] or [address] [phone]   [facility] [address] [place] [phone], phone; [phone], fax Sees patients 1st and 3rd Saturday of every month.  Must not qualify for public or private insurance (i.e. Medicaid, Medicare, [place] Health Choice, Veterans' Benefits)  Household income should be no more than 200% of the poverty level The clinic cannot treat you if you are pregnant or think you are pregnant  Sexually transmitted diseases are not treated at  the clinic.    Dental Care: Organization         Address  Phone  Notes  [facility] Department of  Public Health [facility] [address] [place], [place] [phone] Accepts children up to age 27 who are enrolled in [place] or [place] Health Choice; pregnant women with a Medicaid card; and children who have applied for Medicaid or [place] Health Choice, but were declined, whose parents can pay a reduced fee at time of service.  [facility] Department of [facility]  [address], [place] [phone] Accepts children up to age 74 who are enrolled in [place] or [place] Health Choice; pregnant women with a Medicaid card; and children who have applied for Medicaid or [place] Health Choice, but were declined, whose parents can pay a reduced fee at time of service.  [facility]  [address] [place], [place] [phone] Patients are seen by appointment only. Walk-ins are not accepted. [facility] will see patients 96 years of age and older. Monday - Tuesday (8am-5pm) Most Wednesdays (8:30-5pm) $30 per visit, cash only  [facility] Adult Dental Access PRO[facility]  [address], [facility] [phone] Patients are seen by appointment only. Walk-ins are not accepted. [facility] will see patients 26 years of age and older. One Wednesday Evening (Monthly: Volunteer Based).  $30 per visit, cash only  [employer]  [phone] for adults; Children under age 81, call Graduate Pediatric Dentistry at [phone]. Children aged 32-14, please call [phone] to request a pediatric application.  Dental services are provided in all areas of dental care including fillings, crowns and bridges, complete and partial dentures, implants, gum treatment, root canals, and extractions. Preventive care is also provided. Treatment is provided to both adults and children. Patients are selected via a lottery and there is often a  waiting list.   [facility] [address], [place]  [phone] [email or web address]   [facility] [address] [place], [place] [phone], Ext. [phone] Second and Fourth Thursday of each month, opens at 6:30 AM; Clinic ends at 9 AM.  Patients are seen on a first-come first-served basis, and a limited number are seen during each clinic.   [facility]  [address] Ether Griffins [place], [place] [phone]   Eligibility Requirements You must have lived in [place], [place], or [place] counties for at least the last three months.   You cannot be eligible for state or federal sponsored [place], including [employer], [place], or [employer].   You generally cannot be eligible for healthcare insurance through your employer.    How to apply: Eligibility screenings are held every Tuesday and Wednesday afternoon from 1:00 pm until 4:00 pm. You do not need an appointment for the interview!  [facility] [address], [place], [place] [phone]   [facility] Health Department  [phone]   [facility] Health Department  [phone]   [facility] Health Department  [phone]    Behavioral Health Resources in the Community: Intensive Outpatient Programs Organization         Address  Phone  Notes  [facility] Services [address] [place], [place] [phone]   [facility] Outpatient [address] [place], [place] [phone]   [facility] [address], [place], [place]  [phone]   [facility] Mental Health [address],  [place], [place] [phone] or [phone]   Substance Abuse Resources Organization         Address  Phone  Notes  [facility]  [phone]   [facility]  [phone]   [facility] [place]  [phone]   Floydene Flock  [phone]   Residential & Outpatient Substance Abuse  Program  [phone]   Psychological Services Organization         Address  Phone  Notes  [facility] Behavioral Health  [phone]   [facility]  [phone]   [facility] Mental Health [address] [place] [phone] or [phone]    Mobile Crisis Teams Organization         Address  Phone  Notes  [facility], Mobile Crisis Care Unit  [phone]   [facility]  [address] [place], [place] [phone]   Doristine Locks [address] [place] [phone]    Self-Help/Support Groups Organization         Address  Phone             Notes  [facility] [place] - variety of support groups  336- I7437963 Call for more information  Narcotics Anonymous (NA), [facility] [address], [employer] [place]  2 meetings at this location   Statistician         Address  Phone  Notes  [facility] [address],    [place]  [phone]   [facility]  [address], [place] [address], [place], [place] [phone]   [facility] [address] [place], [place] [phone] Admissions: 8am-3pm M-F  [facility] [address],    [place], [place] [phone]   [facility] [address] [place], [place], [place] [phone]   [facility] [address]  [place], [place] [phone]   [facility] - Intensive Outpatient [address]urell Josephs [address], [place], [place] [phone]   [facility] ([facility] [address] [place]  [place], [place] [phone] or [phone]   [facility] ([facility]) [address] [place], [place] [phone] Accepts Medicaid  [facility] [place] [address],  [place] [phone] Substance Abuse/Addiction Treatment   [facility] Organization          Address  Phone  Notes  [facility]  [phone]   Angie Fava, PhD [address] Ervin Knack [place], [place]   [phone] or [phone]   [facility] Behavioral   [address] [place], [place] [phone]   [facility] [address], [place], [place] [phone] Insurance/Medicaid/sponsorship through [facility] and Families [address]                                    [place], [place] [phone] Therapy/tele-psych/case  [facility] [address], [place] [phone]    Dr. Lolly Mustache  [phone]   [facility]  [employer] [facility] Dept. 1) [address], [place] 2) [address], [place] 3)  [address], [place] [phone]  [phone]   [facility] Child Abuse Hotline 704-542-7068 or [phone] (After Hours)

## 2013-09-13 NOTE — ED Provider Notes (Signed)
CSN: [id number]     Arrival date & time 09/13/13  1440 History   First MD Initiated Contact with Patient 09/13/13 1554     Chief Complaint  Patient presents with  . Leg Pain  . Sore Throat    HPI  Joan Leverheresa Cunliffe is a 53 y.o. female with a PMH of asthma who presents to the ED for evaluation of leg pain and sore throat. History was provided by the patient. Patient states she developed a sore throat last night. Has pain with swallowing. No difficulty controlling secretions/breathing, neck swelling, or voice change. Took Ibuprofen with no relief. Sick contacts include her grandchildren who also have strep throat and are being treated with antibiotics. Patient denies any fevers, chills, rhinorrhea, congestion, ear pain, or cough. She has a hx of asthma and has had intermittent wheezing off and on but has ran out of her inhaler. Denies SOB, chest pain or wheezing currently. Does not have a PCP. She also developed thigh pain this morning at work. States she does a lot of bending, walking and lifting for her job. Pain is located in her anterior things bilaterally without radiation. No posterior leg or calf pain. No leg or joint swelling, redness, rash or wounds. No injuries or trauma. Pain is worse with movement and palpation. She denies similar pain in the past. No weakness, loss of sensation, numbness/tingling, back pain, abdominal pain, nausea, or vomiting. No history of DVT, PE, clotting disorder, cancer, recent surgery, immobilization, travel, or estrogen supplementation.    Past Medical History  Diagnosis Date  . Asthma   . Obesity    Past Surgical History  Procedure Laterality Date  . Shoulder surgery    . Dislocated shoulder repair      r   No family history on file. History  Substance Use Topics  . Smoking status: Never Smoker   . Smokeless tobacco: Not on file  . Alcohol Use: Yes     Comment: wine occasional   OB History   Grav Para Term Preterm Abortions TAB SAB Ect Mult Living                  Review of Systems  Constitutional: Negative for fever, chills, diaphoresis, activity change, appetite change and fatigue.  HENT: Positive for sore throat. Negative for congestion, ear pain, rhinorrhea, trouble swallowing and voice change.   Respiratory: Positive for wheezing. Negative for cough, chest tightness and shortness of breath.   Cardiovascular: Negative for chest pain and leg swelling.  Gastrointestinal: Negative for nausea, vomiting, abdominal pain and diarrhea.  Genitourinary: Negative for dysuria and difficulty urinating.  Musculoskeletal: Positive for myalgias. Negative for arthralgias, back pain, gait problem, neck pain and neck stiffness.  Skin: Negative for color change, rash and wound.  Neurological: Negative for dizziness, weakness, light-headedness, numbness and headaches.    Allergies  Percocet  Home Medications   Prior to Admission medications   Medication Sig Start Date End Date Taking? Authorizing Provider  acetaminophen (TYLENOL) 500 MG tablet Take 1,000 mg by mouth 2 (two) times daily as needed for pain.    Historical Provider, MD  ibuprofen (ADVIL,MOTRIN) 800 MG tablet Take 1 tablet (800 mg total) by mouth 3 (three) times daily. 08/17/13   Fayrene HelperBowie Tran, PA-C  methocarbamol (ROBAXIN) 500 MG tablet Take 1 tablet (500 mg total) by mouth 2 (two) times daily. 08/17/13   Fayrene HelperBowie Tran, PA-C  Multiple Vitamins-Minerals (MULTIVITAMIN PO) Take 1 tablet by mouth daily.    Historical Provider, MD  BP 128/68  Pulse 92  Temp(Src) 98.4 F (36.9 C) (Oral)  Resp 18  Ht 5\' 2"  (1.575 m)  Wt 265 lb (120.203 kg)  BMI 48.46 kg/m2  SpO2 100%  Filed Vitals:   09/13/13 1443 09/13/13 1721  BP: 128/68 153/73  Pulse: 92 90  Temp: 98.4 F (36.9 C) 99.1 F (37.3 C)  TempSrc: Oral Oral  Resp: 18 20  Height: 5\' 2"  (1.575 m)   Weight: 265 lb (120.203 kg)   SpO2: 100% 100%    Physical Exam  Nursing note and vitals reviewed. Constitutional: She is oriented to  person, place, and time. She appears well-developed and well-nourished. No distress.  Non-toxic  HENT:  Head: Normocephalic and atraumatic.  Right Ear: External ear normal.  Left Ear: External ear normal.  Nose: Nose normal.  Mouth/Throat: Oropharyngeal exudate present.  Erythema and exudates to the tonsils bilaterally. Tonsils 2+ bilaterally. Erythema to the posterior pharynx. Uvula midline. No trismus. No voice change. No difficulty controlling secretions. Tympanic membranes gray and translucent bilaterally with no erythema, edema, or hemotympanum.  No mastoid or tragal tenderness bilaterally.   Eyes: Conjunctivae are normal. Pupils are equal, round, and reactive to light. Right eye exhibits no discharge. Left eye exhibits no discharge.  Neck: Normal range of motion. Neck supple.  No cervical lymphadenopathy. No nuchal rigidity.   Cardiovascular: Normal rate, regular rhythm, normal heart sounds and intact distal pulses.  Exam reveals no gallop and no friction rub.   No murmur heard. Dorsalis pedis pulses present and equal bilaterally  Pulmonary/Chest: Effort normal and breath sounds normal. No respiratory distress. She has no wheezes. She has no rales. She exhibits no tenderness.  Abdominal: Soft. She exhibits no distension. There is no tenderness.  Musculoskeletal: Normal range of motion. She exhibits tenderness. She exhibits no edema.  Diffuse tenderness to palpation to the anterior thighs bilaterally and throughout. Pain worse with walking. No hip, knee, calf, ankle, or foot pain. No edema, erythema, ecchymosis, rash or wounds to the LE throughout. ROM without limitations in the LE bilaterally. No lumbar tenderness. Strength 5/5 in the lower extremities bilaterally.  No LE edema bilaterally.   Neurological: She is alert and oriented to person, place, and time.  Gross sensation intact in the LE bilaterally  Skin: Skin is warm and dry. She is not diaphoretic.       ED Course  Procedures  (including critical care time) Labs Review Labs Reviewed - No data to display  Imaging Review No results found.   EKG Interpretation None     Results for orders placed during the hospital encounter of 09/13/13  RAPID STREP SCREEN      Result Value Ref Range   Streptococcus, Group A Screen (Direct) POSITIVE (*) NEGATIVE     MDM   Shailene Demonbreun is a 53 y.o. female with a PMH of asthma who presents to the ED for evaluation of leg pain and sore throat. Sore throat likely due to strep pharyngitis. Positive rapid strep. Patient treated with IM penicillin in the ED. Able to tolerate PO fluids without difficulty. No signs or symptoms of a peritonsillar or retropharyngeal abscess. Patient afebrile and non-toxic in appearance. Etiology of leg pain possibly due to myalgias from illness vs strain. No evidence of a septic joint, cellulitis, or other infectious process. Patient neurovascularly intact. No injuries or trauma. No evidence of a DVT. Patient encouraged to use RICE method. Follow-up with PCP if symptoms not improving or worsening. Patient has hx of  asthma has has been having intermittent wheezing. Lungs clear to auscultation on my exam. No hypoxia, respiratory distress, or tachypnea. Patient given albuterol inhaler prescription and encouraged to follow-up with PCP for further refills and management. Return precautions, discharge instructions, and follow-up was discussed with the patient before discharge.    Rechecks  5:15 PM = Pain improved. Tolerating PO fluids without difficulty.   Discharge Medication List as of 09/13/2013  5:11 PM    START taking these medications   Details  albuterol (PROVENTIL HFA;VENTOLIN HFA) 108 (90 BASE) MCG/ACT inhaler Inhale 1-2 puffs into the lungs every 6 (six) hours as needed for wheezing or shortness of breath., Starting 09/13/2013, Until Discontinued, Print         Final impressions: 1. Strep pharyngitis   2. Bilateral thigh pain       Joan Fisher  Katlin Aisa Schoeppner PA-C         Jillyn LedgerJessica K Gianlucas Evenson, [place] JerseyPA-C 09/15/13 1055

## 2013-09-13 NOTE — ED Notes (Signed)
Last dose of motrin was at 0700

## 2013-09-17 NOTE — ED Provider Notes (Signed)
Medical screening examination/treatment/procedure(s) were performed by non-physician practitioner and as supervising physician I was immediately available for consultation/collaboration.  Joan PoundMichael Y. Ryonna Cimini, MD 09/17/13 1418

## 2013-11-22 ENCOUNTER — Encounter (HOSPITAL_COMMUNITY): Payer: Self-pay | Admitting: Emergency Medicine

## 2013-11-22 ENCOUNTER — Emergency Department (HOSPITAL_COMMUNITY)
Admission: EM | Admit: 2013-11-22 | Discharge: 2013-11-22 | Disposition: A | Discharge status: Home / Self Care | Payer: Self-pay | Attending: Emergency Medicine | Admitting: Emergency Medicine

## 2013-11-22 ENCOUNTER — Other Ambulatory Visit: Payer: Self-pay

## 2013-11-22 ENCOUNTER — Emergency Department (HOSPITAL_COMMUNITY): Payer: Self-pay

## 2013-11-22 DIAGNOSIS — R5381 Other malaise: Secondary | ICD-10-CM | POA: Insufficient documentation

## 2013-11-22 DIAGNOSIS — R0789 Other chest pain: Secondary | ICD-10-CM | POA: Insufficient documentation

## 2013-11-22 DIAGNOSIS — R5383 Other fatigue: Secondary | ICD-10-CM

## 2013-11-22 DIAGNOSIS — Z791 Long term (current) use of non-steroidal anti-inflammatories (NSAID): Secondary | ICD-10-CM | POA: Insufficient documentation

## 2013-11-22 DIAGNOSIS — R531 Weakness: Secondary | ICD-10-CM

## 2013-11-22 DIAGNOSIS — E860 Dehydration: Secondary | ICD-10-CM | POA: Insufficient documentation

## 2013-11-22 DIAGNOSIS — Z79899 Other long term (current) drug therapy: Secondary | ICD-10-CM | POA: Insufficient documentation

## 2013-11-22 DIAGNOSIS — E669 Obesity, unspecified: Secondary | ICD-10-CM | POA: Insufficient documentation

## 2013-11-22 DIAGNOSIS — J45901 Unspecified asthma with (acute) exacerbation: Secondary | ICD-10-CM | POA: Insufficient documentation

## 2013-11-22 LAB — BASIC METABOLIC PANEL
BUN: 15 mg/dL (ref 6–23)
CHLORIDE: 107 meq/L (ref 96–112)
CO2: 26 meq/L (ref 19–32)
Calcium: 9.3 mg/dL (ref 8.4–10.5)
Creatinine, Ser: 1 mg/dL (ref 0.50–1.10)
GFR calc Af Amer: 74 mL/min — ABNORMAL LOW (ref >90)
GFR calc non Af Amer: 64 mL/min — ABNORMAL LOW (ref >90)
Glucose, Bld: 102 mg/dL — ABNORMAL HIGH (ref 70–99)
Potassium: 4 mEq/L (ref 3.7–5.3)
Sodium: 143 mEq/L (ref 137–147)

## 2013-11-22 LAB — CBC
HEMATOCRIT: 37.4 % (ref 36.0–46.0)
HEMOGLOBIN: 12.1 g/dL (ref 12.0–15.0)
MCH: 29 pg (ref 26.0–34.0)
MCHC: 32.4 g/dL (ref 30.0–36.0)
MCV: 89.7 fL (ref 78.0–100.0)
Platelets: 228 10*3/uL (ref 150–400)
RBC: 4.17 MIL/uL (ref 3.87–5.11)
RDW: 14.2 % (ref 11.5–15.5)
WBC: 6.3 10*3/uL (ref 4.0–10.5)

## 2013-11-22 LAB — I-STAT TROPONIN, ED: TROPONIN I, POC: 0 ng/mL (ref 0.00–0.08)

## 2013-11-22 MED ORDER — ASPIRIN 81 MG PO CHEW
324.0000 mg | CHEWABLE_TABLET | Freq: Once | ORAL | Status: AC
  Administered 2013-11-22: 324 mg via ORAL

## 2013-11-22 MED ORDER — SODIUM CHLORIDE 0.9 % IV BOLUS (SEPSIS)
1000.0000 mL | Freq: Once | INTRAVENOUS | Status: AC
  Administered 2013-11-22: 1000 mL via INTRAVENOUS

## 2013-11-22 MED ORDER — ASPIRIN 81 MG PO CHEW
324.0000 mg | CHEWABLE_TABLET | Freq: Once | ORAL | Status: DC
  Filled 2013-11-22: qty 4

## 2013-11-22 NOTE — ED Notes (Signed)
Pt alert x4 respirations easy non labored.  

## 2013-11-22 NOTE — Discharge Instructions (Signed)
Continue albuterol inhaler as needed. Ease into exercising slowly. Drink plenty of fluids. Follow up with primary care doctor for recheck.     Chest Pain (Nonspecific) It is often hard to give a specific diagnosis for the cause of chest pain. There is always a chance that your pain could be related to something serious, such as a heart attack or a blood clot in the lungs. You need to follow up with your health care provider for further evaluation. CAUSES   Heartburn.  Pneumonia or bronchitis.  Anxiety or stress.  Inflammation around your heart (pericarditis) or lung (pleuritis or pleurisy).  A blood clot in the lung.  A collapsed lung (pneumothorax). It can develop suddenly on its own (spontaneous pneumothorax) or from trauma to the chest.  Shingles infection (herpes zoster virus). The chest wall is composed of bones, muscles, and cartilage. Any of these can be the source of the pain.  The bones can be bruised by injury.  The muscles or cartilage can be strained by coughing or overwork.  The cartilage can be affected by inflammation and become sore (costochondritis). DIAGNOSIS  Lab tests or other studies may be needed to find the cause of your pain. Your health care provider may have you take a test called an ambulatory electrocardiogram (ECG). An ECG records your heartbeat patterns over a 24-hour period. You may also have other tests, such as:  Transthoracic echocardiogram (TTE). During echocardiography, sound waves are used to evaluate how blood flows through your heart.  Transesophageal echocardiogram (TEE).  Cardiac monitoring. This allows your health care provider to monitor your heart rate and rhythm in real time.  Holter monitor. This is a portable device that records your heartbeat and can help diagnose heart arrhythmias. It allows your health care provider to track your heart activity for several days, if needed.  Stress tests by exercise or by giving medicine that  makes the heart beat faster. TREATMENT   Treatment depends on what may be causing your chest pain. Treatment may include:  Acid blockers for heartburn.  Anti-inflammatory medicine.  Pain medicine for inflammatory conditions.  Antibiotics if an infection is present.  You may be advised to change lifestyle habits. This includes stopping smoking and avoiding alcohol, caffeine, and chocolate.  You may be advised to keep your head raised (elevated) when sleeping. This reduces the chance of acid going backward from your stomach into your esophagus. Most of the time, nonspecific chest pain will improve within 2-3 days with rest and mild pain medicine.  HOME CARE INSTRUCTIONS   If antibiotics were prescribed, take them as directed. Finish them even if you start to feel better.  For the next few days, avoid physical activities that bring on chest pain. Continue physical activities as directed.  Do not use any tobacco products, including cigarettes, chewing tobacco, or electronic cigarettes.  Avoid drinking alcohol.  Only take medicine as directed by your health care provider.  Follow your health care provider's suggestions for further testing if your chest pain does not go away.  Keep any follow-up appointments you made. If you do not go to an appointment, you could develop lasting (chronic) problems with pain. If there is any problem keeping an appointment, call to reschedule. SEEK MEDICAL CARE IF:   Your chest pain does not go away, even after treatment.  You have a rash with blisters on your chest.  You have a fever. SEEK IMMEDIATE MEDICAL CARE IF:   You have increased chest pain or pain  that spreads to your arm, neck, jaw, back, or abdomen.  You have shortness of breath.  You have an increasing cough, or you cough up blood.  You have severe back or abdominal pain.  You feel nauseous or vomit.  You have severe weakness.  You faint.  You have chills. This is an  emergency. Do not wait to see if the pain will go away. Get medical help at once. Call your local emergency services (911 in [place] Do not drive yourself to the hospital. MAKE SURE YOU:   Understand these instructions.  Will watch your condition.  Will get help right away if you are not doing well or get worse. Document Released: 02/19/2005 Document Revised: 05/17/2013 Document Reviewed: 12/16/2007 [facility] Patient Information 2015 [place], [place]. This information is not intended to replace advice given to you by your health care provider. Make sure you discuss any questions you have with your health care provider.

## 2013-11-22 NOTE — ED Notes (Signed)
Presents with onset of  Sternal chest pain began yesterday while exercising associated with SOB, malaise,  pain described as pressure. Pain is intermittent and is worse with exertion and better with rest.  Denies nausea, reprots dizziness with exertion.

## 2013-11-22 NOTE — ED Provider Notes (Signed)
CSN: [id number]     Arrival date & time 11/22/13  1651 History   First MD Initiated Contact with Patient 11/22/13 1755     Chief Complaint  Patient presents with  . Chest Pain     (Consider location/radiation/quality/duration/timing/severity/associated sxs/prior Treatment) HPI Joan Fisher is a 53 y.o. female who presents to ED with complaint of chest pain and fatigue. Patient states she began exercising a week ago. States does some walking on a treadmill and some simple machines at [employer]. States when she started exercising she began feeling fatigued and drained of energy. She states that she developed chest tightness with shortness of breath yesterday after she came home from the gym and this morning and that is why she came to the emergency department. She does have history of asthma and she tried to take her inhaler today which did help. She also reports spending a lot of time outside in the heat with her grandchildren. She admits to not drinking a lot of water. She states she feels like she is drained of energy and feels dehydrated. She denies any cardiac history in the past. She denies smoking. She has no family history of cardiac disease. She denies elevated cholesterol, hypertension, diabetes. She does not take any medications on daily basis. She denies any recent illnesses, no fever chills, cough, swelling of extremities. No recent travel or surgeries.   Past Medical History  Diagnosis Date  . Asthma   . Obesity    Past Surgical History  Procedure Laterality Date  . Shoulder surgery    . Dislocated shoulder repair      r   History reviewed. No pertinent family history. History  Substance Use Topics  . Smoking status: Never Smoker   . Smokeless tobacco: Not on file  . Alcohol Use: Yes     Comment: wine occasional   OB History   Grav Para Term Preterm Abortions TAB SAB Ect Mult Living                 Review of Systems  Constitutional: Positive for fatigue.  Negative for fever and chills.  Respiratory: Positive for chest tightness and shortness of breath. Negative for cough and wheezing.   Cardiovascular: Positive for chest pain. Negative for palpitations and leg swelling.  Gastrointestinal: Negative for nausea, vomiting, abdominal pain and diarrhea.  Genitourinary: Negative for dysuria, flank pain, vaginal bleeding, vaginal discharge, vaginal pain and pelvic pain.  Musculoskeletal: Negative for arthralgias, myalgias, neck pain and neck stiffness.  Skin: Negative for rash.  Neurological: Positive for weakness. Negative for dizziness and headaches.  All other systems reviewed and are negative.     Allergies  Percocet  Home Medications   Prior to Admission medications   Medication Sig Start Date End Date Taking? Authorizing Provider  acetaminophen (TYLENOL) 500 MG tablet Take 1,000 mg by mouth 2 (two) times daily as needed for pain.   Yes Historical Provider, MD  albuterol (PROVENTIL HFA;VENTOLIN HFA) 108 (90 BASE) MCG/ACT inhaler Inhale 1-2 puffs into the lungs every 6 (six) hours as needed for wheezing or shortness of breath. 09/13/13  Yes Jillyn LedgerJessica K Palmer, PA-C  ibuprofen (ADVIL,MOTRIN) 800 MG tablet Take 1 tablet (800 mg total) by mouth 3 (three) times daily. 08/17/13  Yes Fayrene HelperBowie Tran, PA-C  Multiple Vitamins-Minerals (MULTIVITAMIN PO) Take 1 tablet by mouth daily.   Yes Historical Provider, MD   BP 141/69  Pulse 69  Temp(Src) 98.7 F (37.1 C) (Oral)  Resp 18  SpO2 100%  Physical Exam  Nursing note and vitals reviewed. Constitutional: She appears well-developed and well-nourished. No distress.  HENT:  Head: Normocephalic.  Eyes: Conjunctivae are normal.  Neck: Neck supple.  Cardiovascular: Normal rate, regular rhythm and normal heart sounds.   Pulmonary/Chest: Effort normal and breath sounds normal. No respiratory distress. She has no wheezes. She has no rales. She exhibits no tenderness.  Abdominal: Soft. Bowel sounds are normal.  She exhibits no distension. There is no tenderness. There is no rebound.  Musculoskeletal: She exhibits no edema.  Neurological: She is alert.  Skin: Skin is warm and dry.  Psychiatric: She has a normal mood and affect. Her behavior is normal.    ED Course  Procedures (including critical care time) Labs Review Labs Reviewed  BASIC METABOLIC PANEL - Abnormal; Notable for the following:    Glucose, Bld 102 (*)    GFR calc non Af Amer 64 (*)    GFR calc Af Amer 74 (*)    All other components within normal limits  CBC  PRO B NATRIURETIC PEPTIDE  I-STAT TROPOININ, ED    Imaging Review Dg Chest 2 View  11/22/2013   CLINICAL DATA:  Mid chest pain, nonsmoker  EXAM: CHEST  2 VIEW  COMPARISON:  04/06/2013  FINDINGS: The heart size and mediastinal contours are within normal limits. Both lungs are clear. The visualized skeletal structures are unremarkable.  IMPRESSION: No active cardiopulmonary disease.   Electronically Signed   By: Esperanza Heiraymond  Rubner M.D.   On: 11/22/2013 18:52     EKG Interpretation None      MDM   Final diagnoses:  Dehydration  Chest pressure  Weakness    A sent here with chest tightness and shortness of breath after exercising, generalized fatigue, just started exercising one week ago. This is when her symptoms started. Patient is morbidly obese, 265 pounds. She has not exercised before. She is TIMI 0 and heart score 1. Her pressure has been there since yesterday and improved with albuterol. Do not think this is cardiac. Pt rehydrated. She is feeling comfortable going home. She is PERC negative. i suspect her symptoms are most likely due to physical deconditioning and exercise intollerance. Discussed that if she develops CP or dizziness DURING exercising, she needs to stop and report to ER. Instructed to follow up with PCP as soon as able for further testing.   Filed Vitals:   11/22/13 1703 11/22/13 1903 11/22/13 2000  BP: 141/69 122/58 127/51  Pulse: 69 70 61   Temp: 98.7 F (37.1 C)    TempSrc: Oral    Resp: 18 14 16   SpO2: 100% 100% 100%       Tatyana A Kirichenko, PA-C 11/23/13 0006

## 2013-11-29 NOTE — ED Provider Notes (Signed)
Medical screening examination/treatment/procedure(s) were performed by non-physician practitioner and as supervising physician I was immediately available for consultation/collaboration.   EKG Interpretation   Date/Time:  Tuesday November 22 2013 16:55:15 EDT Ventricular Rate:  79 PR Interval:  174 QRS Duration: 84 QT Interval:  382 QTC Calculation: 438 R Axis:   31 Text Interpretation:  Sinus rhythm with marked sinus arrhythmia Otherwise  normal ECG ED PHYSICIAN INTERPRETATION AVAILABLE IN [facility]  Confirmed by TEST, Record ([id number]) on 11/24/2013 7:05:10 AM        Rolland PorterMark James, MD 11/29/13 [phone]

## 2014-01-04 ENCOUNTER — Ambulatory Visit: Payer: Self-pay | Admitting: Internal Medicine

## 2014-01-24 ENCOUNTER — Ambulatory Visit: Payer: Self-pay

## 2014-03-06 ENCOUNTER — Encounter (HOSPITAL_COMMUNITY): Payer: Self-pay | Admitting: Emergency Medicine

## 2014-03-06 ENCOUNTER — Emergency Department (HOSPITAL_COMMUNITY)
Admission: EM | Admit: 2014-03-06 | Discharge: 2014-03-06 | Disposition: A | Discharge status: Home / Self Care | Payer: Self-pay | Attending: Emergency Medicine | Admitting: Emergency Medicine

## 2014-03-06 DIAGNOSIS — Z79899 Other long term (current) drug therapy: Secondary | ICD-10-CM | POA: Insufficient documentation

## 2014-03-06 DIAGNOSIS — E669 Obesity, unspecified: Secondary | ICD-10-CM | POA: Insufficient documentation

## 2014-03-06 DIAGNOSIS — K047 Periapical abscess without sinus: Secondary | ICD-10-CM | POA: Insufficient documentation

## 2014-03-06 DIAGNOSIS — J45909 Unspecified asthma, uncomplicated: Secondary | ICD-10-CM | POA: Insufficient documentation

## 2014-03-06 DIAGNOSIS — Z791 Long term (current) use of non-steroidal anti-inflammatories (NSAID): Secondary | ICD-10-CM | POA: Insufficient documentation

## 2014-03-06 MED ORDER — HYDROCODONE-ACETAMINOPHEN 5-325 MG PO TABS: 1.0000 | ORAL_TABLET | ORAL | Status: DC | PRN

## 2014-03-06 MED ORDER — PENICILLIN V POTASSIUM 500 MG PO TABS: 500.0000 mg | ORAL_TABLET | Freq: Four times a day (QID) | ORAL | Status: DC

## 2014-03-06 NOTE — ED Provider Notes (Signed)
CSN: [id number]     Arrival date & time 03/06/14  1419 History   First MD Initiated Contact with Patient 03/06/14 1457     Chief Complaint  Patient presents with  . Dental Pain    HPI  Presents with pain along upper tooth. This has a drainage from an area above the tooth on the gum. presents here.  No fever. States the pain has improved last 24 hours. No external swelling. No posterior pharyngeal swelling.  Past Medical History  Diagnosis Date  . Asthma   . Obesity    Past Surgical History  Procedure Laterality Date  . Shoulder surgery    . Dislocated shoulder repair      r   History reviewed. No pertinent family history. History  Substance Use Topics  . Smoking status: Never Smoker   . Smokeless tobacco: Not on file  . Alcohol Use: Yes     Comment: wine occasional   OB History   Grav Para Term Preterm Abortions TAB SAB Ect Mult Living                 Review of Systems  Constitutional: Negative for fever, chills, diaphoresis, appetite change and fatigue.  HENT: Positive for dental problem. Negative for mouth sores, sore throat and trouble swallowing.   Eyes: Negative for visual disturbance.  Respiratory: Negative for cough, chest tightness, shortness of breath and wheezing.   Cardiovascular: Negative for chest pain.  Gastrointestinal: Negative for nausea, vomiting, abdominal pain, diarrhea and abdominal distention.  Endocrine: Negative for polydipsia, polyphagia and polyuria.  Genitourinary: Negative for dysuria, frequency and hematuria.  Musculoskeletal: Negative for gait problem.  Skin: Negative for color change, pallor and rash.  Neurological: Negative for dizziness, syncope, light-headedness and headaches.  Hematological: Does not bruise/bleed easily.  Psychiatric/Behavioral: Negative for behavioral problems and confusion.      Allergies  Percocet  Home Medications   Prior to Admission medications   Medication Sig Start Date End Date Taking? Authorizing  Provider  acetaminophen (TYLENOL) 500 MG tablet Take 1,000 mg by mouth 2 (two) times daily as needed for pain.    Historical Provider, MD  albuterol (PROVENTIL HFA;VENTOLIN HFA) 108 (90 BASE) MCG/ACT inhaler Inhale 1-2 puffs into the lungs every 6 (six) hours as needed for wheezing or shortness of breath. 09/13/13   Jillyn LedgerJessica K Palmer, PA-C  HYDROcodone-acetaminophen (NORCO/VICODIN) 5-325 MG per tablet Take 1 tablet by mouth every 4 (four) hours as needed. 03/06/14   Rolland PorterMark Bilan Tedesco, MD  ibuprofen (ADVIL,MOTRIN) 800 MG tablet Take 1 tablet (800 mg total) by mouth 3 (three) times daily. 08/17/13   Fayrene HelperBowie Tran, PA-C  Multiple Vitamins-Minerals (MULTIVITAMIN PO) Take 1 tablet by mouth daily.    Historical Provider, MD  penicillin v potassium (VEETID) 500 MG tablet Take 1 tablet (500 mg total) by mouth 4 (four) times daily. 03/06/14   Rolland PorterMark Velvie Thomaston, MD   BP 114/64  Pulse 88  Temp(Src) 98.2 F (36.8 C) (Oral)  Resp 19  Ht 5\' 2"  (1.575 m)  Wt 260 lb (117.935 kg)  BMI 47.54 kg/m2  SpO2 99% Physical Exam  Constitutional: She is oriented to person, place, and time. She appears well-developed and well-nourished. No distress.  HENT:  Head: Normocephalic.  Mouth/Throat:    Eyes: Conjunctivae are normal. Pupils are equal, round, and reactive to light. No scleral icterus.  Neck: Normal range of motion. Neck supple. No thyromegaly present.  Cardiovascular: Normal rate and regular rhythm.  Exam reveals no gallop and no  friction rub.   No murmur heard. Pulmonary/Chest: Effort normal and breath sounds normal. No respiratory distress. She has no wheezes. She has no rales.  Abdominal: Soft. Bowel sounds are normal. She exhibits no distension. There is no tenderness. There is no rebound.  Musculoskeletal: Normal range of motion.  Neurological: She is alert and oriented to person, place, and time.  Skin: Skin is warm and dry. No rash noted.  Psychiatric: She has a normal mood and affect. Her behavior is normal.     ED Course  Procedures (including critical care time) Labs Review Labs Reviewed - No data to display  Imaging Review No results found.   EKG Interpretation None      MDM   Final diagnoses:  Dental abscess    Patient does have a spontaneously draining dental abscess encouraged followup with dentist for extraction or root canal as soon as possible. Recheck any worsening symptoms.    Rolland PorterMark Aviyana Sonntag, MD 03/06/14 [phone]

## 2014-03-06 NOTE — Discharge Instructions (Signed)
Finish antibiotic.   Pain medication prescription for 1-2 days. Please see da dentist as soon as possible.  Dental Abscess A dental abscess is a collection of infected fluid (pus) from a bacterial infection in the inner part of the tooth (pulp). It usually occurs at the end of the tooth's root.  CAUSES   Severe tooth decay.  Trauma to the tooth that allows bacteria to enter into the pulp, such as a broken or chipped tooth. SYMPTOMS   Severe pain in and around the infected tooth.  Swelling and redness around the abscessed tooth or in the mouth or face.  Tenderness.  Pus drainage.  Bad breath.  Bitter taste in the mouth.  Difficulty swallowing.  Difficulty opening the mouth.  Nausea.  Vomiting.  Chills.  Swollen neck glands. DIAGNOSIS   A medical and dental history will be taken.  An examination will be performed by tapping on the abscessed tooth.  X-rays may be taken of the tooth to identify the abscess. TREATMENT The goal of treatment is to eliminate the infection. You may be prescribed antibiotic medicine to stop the infection from spreading. A root canal may be performed to save the tooth. If the tooth cannot be saved, it may be pulled (extracted) and the abscess may be drained.  HOME CARE INSTRUCTIONS  Only take over-the-counter or prescription medicines for pain, fever, or discomfort as directed by your caregiver.  Rinse your mouth (gargle) often with salt water ( tsp salt in 8 oz [250 ml] of warm water) to relieve pain or swelling.  Do not drive after taking pain medicine (narcotics).  Do not apply heat to the outside of your face.  Return to your dentist for further treatment as directed. SEEK MEDICAL CARE IF:  Your pain is not helped by medicine.  Your pain is getting worse instead of better. SEEK IMMEDIATE MEDICAL CARE IF:  You have a fever or persistent symptoms for more than 2-3 days.  You have a fever and your symptoms suddenly get  worse.  You have chills or a very bad headache.  You have problems breathing or swallowing.  You have trouble opening your mouth.  You have swelling in the neck or around the eye. Document Released: 05/12/2005 Document Revised: 02/04/2012 Document Reviewed: 08/20/2010 [facility] Patient Information 2015 [place], [place]. This information is not intended to replace advice given to you by your health care provider. Make sure you discuss any questions you have with your health care provider.

## 2014-03-06 NOTE — ED Notes (Signed)
Rt. Upper teeth pain. States, "middle; bump on it; and a hole."

## 2014-04-03 ENCOUNTER — Ambulatory Visit: Payer: Self-pay

## 2014-11-04 ENCOUNTER — Encounter (HOSPITAL_COMMUNITY): Payer: Self-pay | Admitting: Emergency Medicine

## 2014-11-04 ENCOUNTER — Emergency Department (HOSPITAL_COMMUNITY): Payer: Self-pay

## 2014-11-04 ENCOUNTER — Emergency Department (HOSPITAL_COMMUNITY)
Admission: EM | Admit: 2014-11-04 | Discharge: 2014-11-04 | Disposition: A | Discharge status: Home / Self Care | Payer: Self-pay | Attending: Emergency Medicine | Admitting: Emergency Medicine

## 2014-11-04 DIAGNOSIS — J45901 Unspecified asthma with (acute) exacerbation: Secondary | ICD-10-CM | POA: Insufficient documentation

## 2014-11-04 DIAGNOSIS — Z79899 Other long term (current) drug therapy: Secondary | ICD-10-CM | POA: Insufficient documentation

## 2014-11-04 DIAGNOSIS — E669 Obesity, unspecified: Secondary | ICD-10-CM | POA: Insufficient documentation

## 2014-11-04 LAB — BASIC METABOLIC PANEL
Anion gap: 9 (ref 5–15)
BUN: 16 mg/dL (ref 6–20)
CO2: 26 mmol/L (ref 22–32)
CREATININE: 1.02 mg/dL — AB (ref 0.44–1.00)
Calcium: 9.2 mg/dL (ref 8.9–10.3)
Chloride: 105 mmol/L (ref 101–111)
GFR calc Af Amer: >60 mL/min (ref >60)
Glucose, Bld: 137 mg/dL — ABNORMAL HIGH (ref 65–99)
Potassium: 3.4 mmol/L — ABNORMAL LOW (ref 3.5–5.1)
SODIUM: 140 mmol/L (ref 135–145)

## 2014-11-04 LAB — CBC WITH DIFFERENTIAL/PLATELET
BASOS ABS: 0 10*3/uL (ref 0.0–0.1)
Basophils Relative: 0 % (ref 0–1)
Eosinophils Absolute: 0.1 10*3/uL (ref 0.0–0.7)
Eosinophils Relative: 2 % (ref 0–5)
HEMATOCRIT: 34.9 % — AB (ref 36.0–46.0)
Hemoglobin: 11.3 g/dL — ABNORMAL LOW (ref 12.0–15.0)
LYMPHS PCT: 43 % (ref 12–46)
Lymphs Abs: 2.9 10*3/uL (ref 0.7–4.0)
MCH: 28.6 pg (ref 26.0–34.0)
MCHC: 32.4 g/dL (ref 30.0–36.0)
MCV: 88.4 fL (ref 78.0–100.0)
MONOS PCT: 7 % (ref 3–12)
Monocytes Absolute: 0.4 10*3/uL (ref 0.1–1.0)
Neutro Abs: 3.2 10*3/uL (ref 1.7–7.7)
Neutrophils Relative %: 48 % (ref 43–77)
PLATELETS: 208 10*3/uL (ref 150–400)
RBC: 3.95 MIL/uL (ref 3.87–5.11)
RDW: 14.3 % (ref 11.5–15.5)
WBC: 6.7 10*3/uL (ref 4.0–10.5)

## 2014-11-04 LAB — I-STAT TROPONIN, ED: TROPONIN I, POC: 0 ng/mL (ref 0.00–0.08)

## 2014-11-04 MED ORDER — PREDNISONE 20 MG PO TABS: 60.0000 mg | ORAL_TABLET | Freq: Once | ORAL | Status: DC

## 2014-11-04 MED ORDER — IPRATROPIUM-ALBUTEROL 0.5-2.5 (3) MG/3ML IN SOLN
3.0000 mL | Freq: Once | RESPIRATORY_TRACT | Status: AC
  Administered 2014-11-04: 3 mL via RESPIRATORY_TRACT

## 2014-11-04 MED ORDER — ALBUTEROL SULFATE HFA 108 (90 BASE) MCG/ACT IN AERS
1.0000 | INHALATION_SPRAY | Freq: Once | RESPIRATORY_TRACT | Status: AC
  Administered 2014-11-04: 1 via RESPIRATORY_TRACT
  Filled 2014-11-04: qty 6.7

## 2014-11-04 MED ORDER — IPRATROPIUM-ALBUTEROL 0.5-2.5 (3) MG/3ML IN SOLN
RESPIRATORY_TRACT | Status: AC
  Filled 2014-11-04: qty 3

## 2014-11-04 MED ORDER — PREDNISONE 50 MG PO TABS: 50.0000 mg | ORAL_TABLET | Freq: Every day | ORAL | Status: DC

## 2014-11-04 NOTE — ED Notes (Signed)
Pt placed in a gown and hooked up to the monitor with a 5 lead, BP cuff and pulse ox 

## 2014-11-04 NOTE — ED Notes (Signed)
Pt sts woke from sleep 20 min ago c/o SOB and asthma sx; pt sts some wheezing noted and denies pain

## 2014-11-04 NOTE — Discharge Instructions (Signed)
We saw you in the ER for your asthma related complains. We gave you some breathing treatments in the ER, and seems like your symptoms have improved. °Please take albuterol as needed every 4 hours. °Please take the medications prescribed. °Please refrain from smoking or smoke exposure. °Please see a primary care doctor in 1 week. °Return to the ER if your symptoms worsen. ° ° °Asthma °Asthma is a recurring condition in which the airways tighten and narrow. Asthma can make it difficult to breathe. It can cause coughing, wheezing, and shortness of breath. Asthma episodes, also called asthma attacks, range from minor to life-threatening. Asthma cannot be cured, but medicines and lifestyle changes can help control it. °CAUSES °Asthma is believed to be caused by inherited (genetic) and environmental factors, but its exact cause is unknown. Asthma may be triggered by allergens, lung infections, or irritants in the air. Asthma triggers are different for each person. Common triggers include:  °· Animal dander. °· Dust mites. °· Cockroaches. °· Pollen from trees or grass. °· Mold. °· Smoke. °· Air pollutants such as dust, household cleaners, hair sprays, aerosol sprays, paint fumes, strong chemicals, or strong odors. °· Cold air, weather changes, and winds (which increase molds and pollens in the air). °· Strong emotional expressions such as crying or laughing hard. °· Stress. °· Certain medicines (such as aspirin) or types of drugs (such as beta-blockers). °· Sulfites in foods and drinks. Foods and drinks that may contain sulfites include dried fruit, potato chips, and sparkling grape juice. °· Infections or inflammatory conditions such as the flu, a cold, or an inflammation of the nasal membranes (rhinitis). °· Gastroesophageal reflux disease (GERD). °· Exercise or strenuous activity. °SYMPTOMS °Symptoms may occur immediately after asthma is triggered or many hours later. Symptoms include: °· Wheezing. °· Excessive  nighttime or early morning coughing. °· Frequent or severe coughing with a common cold. °· Chest tightness. °· Shortness of breath. °DIAGNOSIS  °The diagnosis of asthma is made by a review of your medical history and a physical exam. Tests may also be performed. These may include: °· Lung function studies. These tests show how much air you breathe in and out. °· Allergy tests. °· Imaging tests such as X-rays. °TREATMENT  °Asthma cannot be cured, but it can usually be controlled. Treatment involves identifying and avoiding your asthma triggers. It also involves medicines. There are 2 classes of medicine used for asthma treatment:  °· Controller medicines. These prevent asthma symptoms from occurring. They are usually taken every day. °· Reliever or rescue medicines. These quickly relieve asthma symptoms. They are used as needed and provide short-term relief. °Your health care provider will help you create an asthma action plan. An asthma action plan is a written plan for managing and treating your asthma attacks. It includes a list of your asthma triggers and how they may be avoided. It also includes information on when medicines should be taken and when their dosage should be changed. An action plan may also involve the use of a device called a peak flow meter. A peak flow meter measures how well the lungs are working. It helps you monitor your condition. °HOME CARE INSTRUCTIONS  °· Take medicines only as directed by your health care provider. Speak with your health care provider if you have questions about how or when to take the medicines. °· Use a peak flow meter as directed by your health care provider. Record and keep track of readings. °· Understand and use the action   plan to help minimize or stop an asthma attack without needing to seek medical care.  Control your home environment in the following ways to help prevent asthma attacks:  Do not smoke. Avoid being exposed to secondhand smoke.  Change your  heating and air conditioning filter regularly.  Limit your use of fireplaces and wood stoves.  Get rid of pests (such as roaches and mice) and their droppings.  Throw away plants if you see mold on them.  Clean your floors and dust regularly. Use unscented cleaning products.  Try to have someone else vacuum for you regularly. Stay out of rooms while they are being vacuumed and for a short while afterward. If you vacuum, use a dust mask from a hardware store, a double-layered or microfilter vacuum cleaner bag, or a vacuum cleaner with a HEPA filter.  Replace carpet with wood, tile, or vinyl flooring. Carpet can trap dander and dust.  Use allergy-proof pillows, mattress covers, and box spring covers.  Wash bed sheets and blankets every week in hot water and dry them in a dryer.  Use blankets that are made of polyester or cotton.  Clean bathrooms and kitchens with bleach. If possible, have someone repaint the walls in these rooms with mold-resistant paint. Keep out of the rooms that are being cleaned and painted.  Wash hands frequently. SEEK MEDICAL CARE IF:   You have wheezing, shortness of breath, or a cough even if taking medicine to prevent attacks.  The colored mucus you cough up (sputum) is thicker than usual.  Your sputum changes from clear or white to yellow, green, gray, or bloody.  You have any problems that may be related to the medicines you are taking (such as a rash, itching, swelling, or trouble breathing).  You are using a reliever medicine more than 2-3 times per week.  Your peak flow is still at 50-79% of your personal best after following your action plan for 1 hour.  You have a fever. SEEK IMMEDIATE MEDICAL CARE IF:   You seem to be getting worse and are unresponsive to treatment during an asthma attack.  You are short of breath even at rest.  You get short of breath when doing very little physical activity.  You have difficulty eating, drinking, or  talking due to asthma symptoms.  You develop chest pain.  You develop a fast heartbeat.  You have a bluish color to your lips or fingernails.  You are light-headed, dizzy, or faint.  Your peak flow is less than 50% of your personal best. MAKE SURE YOU:   Understand these instructions.  Will watch your condition.  Will get help right away if you are not doing well or get worse. Document Released: 05/12/2005 Document Revised: 09/26/2013 Document Reviewed: 12/09/2012 [facility] Patient Information 2015 [place], [place]. This information is not intended to replace advice given to you by your health care provider. Make sure you discuss any questions you have with your health care provider.

## 2014-11-04 NOTE — ED Provider Notes (Signed)
CSN: [id number]     Arrival date & time 11/04/14  2952 History  This chart was scribed for Joan Kaplan, MD by Roxy Cedar, ED Scribe. This patient was seen in room D34C/D34C and the patient's care was started at 4:51 AM.   Chief Complaint  Patient presents with  . Shortness of Breath   Patient is a 54 y.o. female presenting with shortness of breath. The history is provided by the patient. No language interpreter was used.  Shortness of Breath Associated symptoms: wheezing    HPI Comments: Joan Fisher is a 54 y.o. female with a PMHx of asthma and obesity, who presents to the Emergency Department complaining of moderate SOB onset at 1AM. Patient awoke from sleep due to increased SOB and wheezing.  She tried using a breathing treatment at home with no relief. She denies associated chest pain. Patient is not a smoker. Patient does not take oral contraceptives. Patient denies recent travel. Patient denies recent surgical history. She denies prior hx of PE in legs or lungs.  Past Medical History  Diagnosis Date  . Asthma   . Obesity    Past Surgical History  Procedure Laterality Date  . Shoulder surgery    . Dislocated shoulder repair      r   History reviewed. No pertinent family history. History  Substance Use Topics  . Smoking status: Never Smoker   . Smokeless tobacco: Not on file  . Alcohol Use: Yes     Comment: wine occasional   OB History    No data available     Review of Systems  Respiratory: Positive for shortness of breath and wheezing.   All other systems reviewed and are negative.  Allergies  Percocet  Home Medications   Prior to Admission medications   Medication Sig Start Date End Date Taking? Authorizing Provider  acetaminophen (TYLENOL) 500 MG tablet Take 1,000 mg by mouth 2 (two) times daily as needed for pain.   Yes Historical Provider, MD  albuterol (PROVENTIL HFA;VENTOLIN HFA) 108 (90 BASE) MCG/ACT inhaler Inhale 1-2 puffs into the lungs every  6 (six) hours as needed for wheezing or shortness of breath. 09/13/13  Yes Jillyn Ledger, PA-C  Calcium Carbonate-Vitamin D (CALTRATE 600+D PO) Take 1 tablet by mouth daily.   Yes Historical Provider, MD  Multiple Vitamins-Minerals (MULTIVITAMIN PO) Take 1 tablet by mouth daily.   Yes Historical Provider, MD  predniSONE (DELTASONE) 50 MG tablet Take 1 tablet (50 mg total) by mouth daily. 11/04/14   Joan Kaplan, MD   Triage Vitals: BP 135/68 mmHg  Pulse 88  Temp(Src) 98 F (36.7 C) (Oral)  Resp 14  SpO2 100%  Physical Exam  Constitutional: She is oriented to person, place, and time. She appears well-developed and well-nourished. No distress.  HENT:  Head: Normocephalic and atraumatic.  Eyes: Conjunctivae and EOM are normal.  Neck: Neck supple. No tracheal deviation present.  Cardiovascular: Normal rate, regular rhythm and normal heart sounds.   Pulmonary/Chest: Effort normal and breath sounds normal. No respiratory distress. She has no wheezes.  Musculoskeletal: Normal range of motion.  Neurological: She is alert and oriented to person, place, and time.  Skin: Skin is warm and dry.  Psychiatric: She has a normal mood and affect. Her behavior is normal.  Nursing note and vitals reviewed.  ED Course  Procedures (including critical care time)  DIAGNOSTIC STUDIES: Oxygen Saturation is 100% on RA, normal by my interpretation.    COORDINATION OF CARE: 4:56 AM-  Ordered diagnostic CXR, EKG and lab work. Gave patient nebulizer breathing treatment. Advised patient to use inhaler every 4 hours as needed. Will give patient prednisone. Pt advised of plan for treatment and pt agrees.  Labs Review Labs Reviewed  BASIC METABOLIC PANEL - Abnormal; Notable for the following:    Potassium 3.4 (*)    Glucose, Bld 137 (*)    Creatinine, Ser 1.02 (*)    All other components within normal limits  CBC WITH DIFFERENTIAL/PLATELET - Abnormal; Notable for the following:    Hemoglobin 11.3 (*)     HCT 34.9 (*)    All other components within normal limits  I-STAT TROPOININ, ED   Imaging Review Dg Chest 2 View (if Patient Has Fever And/or Copd)  11/04/2014   CLINICAL DATA:  Dyspnea  EXAM: CHEST  2 VIEW  COMPARISON:  11/22/2013  FINDINGS: The heart size and mediastinal contours are within normal limits. Both lungs are clear. The visualized skeletal structures are unremarkable.  IMPRESSION: No active cardiopulmonary disease.   Electronically Signed   By: Ellery Plunk M.D.   On: 11/04/2014 04:12     EKG Interpretation   Date/Time:  Saturday November 04 2014 03:19:38 EDT Ventricular Rate:  77 PR Interval:  191 QRS Duration: 87 QT Interval:  382 QTC Calculation: 432 R Axis:   40 Text Interpretation:  Sinus rhythm Consider left atrial enlargement RSR'  in V1 or V2, right VCD or RVH Minimal ST elevation, inferior leads  UNCHANGED FROM BEFORE Confirmed by Rhunette Croft, MD, Janey Genta [phone]) on  11/04/2014 3:33:38 AM     MDM   Final diagnoses:  Asthma exacerbation, mild   I personally performed the services described in this documentation, which was scribed in my presence. The recorded information has been reviewed and is accurate.  Pt with mild asthma exacerbation. She has no wheezing anymore, s/p 1 treatment. No concerns for CHF, infection.  Joan Kaplan, MD 11/04/14 2256

## 2014-11-18 ENCOUNTER — Encounter (HOSPITAL_COMMUNITY): Payer: Self-pay | Admitting: *Deleted

## 2014-11-18 ENCOUNTER — Emergency Department (HOSPITAL_COMMUNITY)
Admission: EM | Admit: 2014-11-18 | Discharge: 2014-11-18 | Disposition: A | Discharge status: Home / Self Care | Payer: Self-pay | Attending: Emergency Medicine | Admitting: Emergency Medicine

## 2014-11-18 DIAGNOSIS — Z7952 Long term (current) use of systemic steroids: Secondary | ICD-10-CM | POA: Insufficient documentation

## 2014-11-18 DIAGNOSIS — J45909 Unspecified asthma, uncomplicated: Secondary | ICD-10-CM | POA: Insufficient documentation

## 2014-11-18 DIAGNOSIS — K088 Other specified disorders of teeth and supporting structures: Secondary | ICD-10-CM | POA: Insufficient documentation

## 2014-11-18 DIAGNOSIS — E669 Obesity, unspecified: Secondary | ICD-10-CM | POA: Insufficient documentation

## 2014-11-18 DIAGNOSIS — Z79899 Other long term (current) drug therapy: Secondary | ICD-10-CM | POA: Insufficient documentation

## 2014-11-18 DIAGNOSIS — K0889 Other specified disorders of teeth and supporting structures: Secondary | ICD-10-CM

## 2014-11-18 MED ORDER — IBUPROFEN 800 MG PO TABS: 800.0000 mg | ORAL_TABLET | Freq: Three times a day (TID) | ORAL | Status: DC

## 2014-11-18 MED ORDER — PENICILLIN V POTASSIUM 500 MG PO TABS: 500.0000 mg | ORAL_TABLET | Freq: Four times a day (QID) | ORAL | Status: AC

## 2014-11-18 NOTE — Discharge Instructions (Signed)
Dental Pain °A tooth ache may be caused by cavities (tooth decay). Cavities expose the nerve of the tooth to air and hot or cold temperatures. It may come from an infection or abscess (also called a boil or furuncle) around your tooth. It is also often caused by dental caries (tooth decay). This causes the pain you are having. °DIAGNOSIS  °Your caregiver can diagnose this problem by exam. °TREATMENT  °· If caused by an infection, it may be treated with medications which kill germs (antibiotics) and pain medications as prescribed by your caregiver. Take medications as directed. °· Only take over-the-counter or prescription medicines for pain, discomfort, or fever as directed by your caregiver. °· Whether the tooth ache today is caused by infection or dental disease, you should see your dentist as soon as possible for further care. °SEEK MEDICAL CARE IF: °The exam and treatment you received today has been provided on an emergency basis only. This is not a substitute for complete medical or dental care. If your problem worsens or new problems (symptoms) appear, and you are unable to meet with your dentist, call or return to this location. °SEEK IMMEDIATE MEDICAL CARE IF:  °· You have a fever. °· You develop redness and swelling of your face, jaw, or neck. °· You are unable to open your mouth. °· You have severe pain uncontrolled by pain medicine. °MAKE SURE YOU:  °· Understand these instructions. °· Will watch your condition. °· Will get help right away if you are not doing well or get worse. °Document Released: 05/12/2005 Document Revised: 08/04/2011 Document Reviewed: 12/29/2007 °[employer]® Patient Information ©2015 [employer]. This information is not intended to replace advice given to you by your health care provider. Make sure you discuss any questions you have with your health care provider. ° °Dental Care and Dentist Visits °Dental care supports good overall health. Regular dental visits can also help you  avoid dental pain, bleeding, infection, and other more serious health problems in the future. It is important to keep the mouth healthy because diseases in the teeth, gums, and other oral tissues can spread to other areas of the body. Some problems, such as diabetes, heart disease, and pre-term labor have been associated with poor oral health.  °See your dentist every 6 months. If you experience emergency problems such as a toothache or broken tooth, go to the dentist right away. If you see your dentist regularly, you may catch problems early. It is easier to be treated for problems in the early stages.  °WHAT TO EXPECT AT A DENTIST VISIT  °Your dentist will look for many common oral health problems and recommend proper treatment. At your regular dental visit, you can expect: °· Gentle cleaning of the teeth and gums. This includes scraping and polishing. This helps to remove the sticky substance around the teeth and gums (plaque). Plaque forms in the mouth shortly after eating. Over time, plaque hardens on the teeth as tartar. If tartar is not removed regularly, it can cause problems. Cleaning also helps remove stains. °· Periodic X-rays. These pictures of the teeth and supporting bone will help your dentist assess the health of your teeth. °· Periodic fluoride treatments. Fluoride is a natural mineral shown to help strengthen teeth. Fluoride treatment involves applying a fluoride gel or varnish to the teeth. It is most commonly done in children. °· Examination of the mouth, tongue, jaws, teeth, and gums to look for any oral health problems, such as: °¨ Cavities (dental caries). This is   decay on the tooth caused by plaque, sugar, and acid in the mouth. It is best to catch a cavity when it is small. °¨ Inflammation of the gums caused by plaque buildup (gingivitis). °¨ Problems with the mouth or malformed or misaligned teeth. °¨ Oral cancer or other diseases of the soft tissues or jaws.  °KEEP YOUR TEETH AND GUMS  HEALTHY °For healthy teeth and gums, follow these general guidelines as well as your dentist's specific advice: °· Have your teeth professionally cleaned at the dentist every 6 months. °· Brush twice daily with a fluoride toothpaste. °· Floss your teeth daily.  °· Ask your dentist if you need fluoride supplements, treatments, or fluoride toothpaste. °· Eat a healthy diet. Reduce foods and drinks with added sugar. °· Avoid smoking. °TREATMENT FOR ORAL HEALTH PROBLEMS °If you have oral health problems, treatment varies depending on the conditions present in your teeth and gums. °· Your caregiver will most likely recommend good oral hygiene at each visit. °· For cavities, gingivitis, or other oral health disease, your caregiver will perform a procedure to treat the problem. This is typically done at a separate appointment. Sometimes your caregiver will refer you to another dental specialist for specific tooth problems or for surgery. °SEEK IMMEDIATE DENTAL CARE IF: °· You have pain, bleeding, or soreness in the gum, tooth, jaw, or mouth area. °· A permanent tooth becomes loose or separated from the gum socket. °· You experience a blow or injury to the mouth or jaw area. °Document Released: 01/22/2011 Document Revised: 08/04/2011 Document Reviewed: 01/22/2011 °Exit[employer]® Patient Information ©2015 [employer]. This information is not intended to replace advice given to you by your health care provider. Make sure you discuss any questions you have with your health care provider. ° °

## 2014-11-18 NOTE — ED Provider Notes (Signed)
CSN: [id number]     Arrival date & time 11/18/14  6237 History   First MD Initiated Contact with Patient 11/18/14 0654     Chief Complaint  Patient presents with  . Dental Pain     (Consider location/radiation/quality/duration/timing/severity/associated sxs/prior Treatment) HPI Joan Fisher is a 54 y.o. female patient comes in for evaluation of dental pain. Patient admits to chronic poor dental hygiene. She reports she does not have a dentist due to lack of insurance. She reports discomfort to her right maxillary premolars. She reports these teeth fell out a year ago, but have since starting to become more painful. She rates her discomfort as an 8/10. Nothing tried to improve symptoms. Palpation exacerbated symptoms. Denies fevers, chills, difficulties breathing, nausea or vomiting, difficulty swallowing.  Past Medical History  Diagnosis Date  . Asthma   . Obesity    Past Surgical History  Procedure Laterality Date  . Shoulder surgery    . Dislocated shoulder repair      r   History reviewed. No pertinent family history. History  Substance Use Topics  . Smoking status: Never Smoker   . Smokeless tobacco: Never Used  . Alcohol Use: Yes     Comment: wine occasional   OB History    No data available     Review of Systems A 10 point review of systems was completed and was negative except for pertinent positives and negatives as mentioned in the history of present illness     Allergies  Percocet  Home Medications   Prior to Admission medications   Medication Sig Start Date End Date Taking? Authorizing Provider  acetaminophen (TYLENOL) 500 MG tablet Take 1,000 mg by mouth 2 (two) times daily as needed for pain.    Historical Provider, MD  albuterol (PROVENTIL HFA;VENTOLIN HFA) 108 (90 BASE) MCG/ACT inhaler Inhale 1-2 puffs into the lungs every 6 (six) hours as needed for wheezing or shortness of breath. 09/13/13   Jillyn Ledger, PA-C  Calcium Carbonate-Vitamin D  (CALTRATE 600+D PO) Take 1 tablet by mouth daily.    Historical Provider, MD  ibuprofen (ADVIL,MOTRIN) 800 MG tablet Take 1 tablet (800 mg total) by mouth 3 (three) times daily. 11/18/14   Joycie Peek, PA-C  Multiple Vitamins-Minerals (MULTIVITAMIN PO) Take 1 tablet by mouth daily.    Historical Provider, MD  penicillin v potassium (VEETID) 500 MG tablet Take 1 tablet (500 mg total) by mouth 4 (four) times daily. 11/18/14 11/25/14  Joycie Peek, PA-C  predniSONE (DELTASONE) 50 MG tablet Take 1 tablet (50 mg total) by mouth daily. 11/04/14   Ankit Nanavati, MD   BP 131/63 mmHg  Pulse 67  Temp(Src) 98.4 F (36.9 C) (Oral)  Resp 17  Ht 5\' 1"  (1.549 m)  Wt 268 lb (121.564 kg)  BMI 50.66 kg/m2  SpO2 100% Physical Exam  Constitutional:  Awake, alert, nontoxic appearance.  HENT:  Head: Atraumatic.  Discomfort located to right maxillary premolars. Overall poor dentition. Multiple missing teeth with active caries. Mucous membranes are moist. No unilateral tonsillar swelling, uvula midline, no glossal swelling or elevation. No trismus. No fluctuance or evidence of a drainable abscess. No other evidence of emergent infection, Retropharyngeal or Peritonsillar abscess, Ludwig or Vincents angina. Tolerating secretions well. Patent airway   Eyes: Right eye exhibits no discharge. Left eye exhibits no discharge.  Neck: Neck supple.  Pulmonary/Chest: Effort normal. She exhibits no tenderness.  Abdominal: Soft. There is no tenderness. There is no rebound.  Musculoskeletal: She exhibits no  tenderness.  Baseline ROM, no obvious new focal weakness.  Neurological:  Mental status and motor strength appears baseline for patient and situation.  Skin: No rash noted.  Psychiatric: She has a normal mood and affect.  Nursing note and vitals reviewed.   ED Course  Procedures (including critical care time) Labs Review Labs Reviewed - No data to display  Imaging Review No results found.   EKG  Interpretation None     Meds given in ED:  Medications - No data to display  New Prescriptions   IBUPROFEN (ADVIL,MOTRIN) 800 MG TABLET    Take 1 tablet (800 mg total) by mouth 3 (three) times daily.   PENICILLIN V POTASSIUM (VEETID) 500 MG TABLET    Take 1 tablet (500 mg total) by mouth 4 (four) times daily.   Filed Vitals:   11/18/14 0654  BP: 131/63  Pulse: 67  Temp: 98.4 F (36.9 C)  TempSrc: Oral  Resp: 17  Height:  (1.549 m)  Weight: 268 lb (121.564 kg)  SpO2: 100%    MDM  Vitals stable - WNL -afebrile Pt resting comfortably in ED. PE--no evidence of retropharyngeal, peritonsillar abscess, Ludwig angina. Chronically poor dentition. Patient declines intraoral nerve block at this time. Prefers to take Motrin for symptoms. Provided With outpatient dentistry resources.  I discussed all relevant lab findings and imaging results with pt and they verbalized understanding. Discussed f/u with PCP within 48 hrs and return precautions, pt very amenable to plan.  Final diagnoses:  Pain, dental       Joycie Peek, PA-C 11/18/14 0716  Joycie Peek, PA-C 11/18/14 0725  Benjiman Core, MD 11/19/14 [phone]

## 2014-11-18 NOTE — ED Notes (Signed)
Pt has two teeth on the right upper jaw that have fallen out and have caused her sone 8/10 pain since yesterday. Pt. Has taken tylenol for the pain with no relief

## 2015-03-22 ENCOUNTER — Encounter (HOSPITAL_COMMUNITY): Payer: Self-pay | Admitting: *Deleted

## 2015-03-22 ENCOUNTER — Emergency Department (HOSPITAL_COMMUNITY)
Admission: EM | Admit: 2015-03-22 | Discharge: 2015-03-22 | Disposition: A | Discharge status: Home / Self Care | Payer: Self-pay | Attending: Emergency Medicine | Admitting: Emergency Medicine

## 2015-03-22 DIAGNOSIS — Z791 Long term (current) use of non-steroidal anti-inflammatories (NSAID): Secondary | ICD-10-CM | POA: Insufficient documentation

## 2015-03-22 DIAGNOSIS — Z79899 Other long term (current) drug therapy: Secondary | ICD-10-CM | POA: Insufficient documentation

## 2015-03-22 DIAGNOSIS — Z7952 Long term (current) use of systemic steroids: Secondary | ICD-10-CM | POA: Insufficient documentation

## 2015-03-22 DIAGNOSIS — E669 Obesity, unspecified: Secondary | ICD-10-CM | POA: Insufficient documentation

## 2015-03-22 DIAGNOSIS — J45901 Unspecified asthma with (acute) exacerbation: Secondary | ICD-10-CM | POA: Insufficient documentation

## 2015-03-22 MED ORDER — IPRATROPIUM-ALBUTEROL 0.5-2.5 (3) MG/3ML IN SOLN
3.0000 mL | Freq: Once | RESPIRATORY_TRACT | Status: AC
  Administered 2015-03-22: 3 mL via RESPIRATORY_TRACT
  Filled 2015-03-22: qty 3

## 2015-03-22 MED ORDER — PREDNISONE 20 MG PO TABS
60.0000 mg | ORAL_TABLET | Freq: Once | ORAL | Status: AC
  Administered 2015-03-22: 60 mg via ORAL
  Filled 2015-03-22: qty 3

## 2015-03-22 MED ORDER — ALBUTEROL SULFATE HFA 108 (90 BASE) MCG/ACT IN AERS
2.0000 | INHALATION_SPRAY | Freq: Once | RESPIRATORY_TRACT | Status: AC
  Administered 2015-03-22: 2 via RESPIRATORY_TRACT
  Filled 2015-03-22: qty 6.7

## 2015-03-22 MED ORDER — PREDNISONE 20 MG PO TABS: 40.0000 mg | ORAL_TABLET | Freq: Every day | ORAL | Status: AC

## 2015-03-22 NOTE — ED Notes (Signed)
Patient is alert and orientedx4.  Patient was explained discharge instructions and they understood them with no questions.   

## 2015-03-22 NOTE — Discharge Instructions (Signed)

## 2015-03-22 NOTE — ED Notes (Signed)
Patient reports sob that started last night, patient with hx of asthma. Did breathing tx at 11a. Patient reports cough. Denies fever.

## 2015-03-22 NOTE — ED Provider Notes (Signed)
CSN: [id number]     Arrival date & time 03/22/15  1701 History  By signing my name below, I, Tanda RockersMargaux Venter, attest that this documentation has been prepared under the direction and in the presence of Arthor CaptainAbigail Auburn Hester, PA-C.  Electronically Signed: Tanda RockersMargaux Venter, ED Scribe. 03/22/2015. 5:37 PM.  Chief Complaint  Patient presents with  . Asthma   The history is provided by the patient. No language interpreter was used.     HPI Comments: Joan Leverheresa Grinage is a 54 y.o. female with hx asthma who presents to the Emergency Department complaining of gradual onset, asthma exacerbation that began 1 day ago. She notes chest tightness, cough, wheezing, and shortness of breath which is usual for her exacerbations. She attributes the exacerbation to the recent weather change. Pt had a breathing treatment without relief. Denies fever, chills, or any other associated symptoms. Pt is non smoker.    Past Medical History  Diagnosis Date  . Asthma   . Obesity    Past Surgical History  Procedure Laterality Date  . Shoulder surgery    . Dislocated shoulder repair      r   History reviewed. No pertinent family history. Social History  Substance Use Topics  . Smoking status: Never Smoker   . Smokeless tobacco: Never Used  . Alcohol Use: Yes     Comment: wine occasional   OB History    No data available     Review of Systems  Constitutional: Negative for fever and chills.  Respiratory: Positive for cough, chest tightness, shortness of breath and wheezing.    Allergies  Percocet  Home Medications   Prior to Admission medications   Medication Sig Start Date End Date Taking? Authorizing Provider  acetaminophen (TYLENOL) 500 MG tablet Take 1,000 mg by mouth 2 (two) times daily as needed for pain.    Historical Provider, MD  albuterol (PROVENTIL HFA;VENTOLIN HFA) 108 (90 BASE) MCG/ACT inhaler Inhale 1-2 puffs into the lungs every 6 (six) hours as needed for wheezing or shortness of breath. 09/13/13    Jillyn LedgerJessica K Palmer, PA-C  Calcium Carbonate-Vitamin D (CALTRATE 600+D PO) Take 1 tablet by mouth daily.    Historical Provider, MD  ibuprofen (ADVIL,MOTRIN) 800 MG tablet Take 1 tablet (800 mg total) by mouth 3 (three) times daily. 11/18/14   Joycie PeekBenjamin Cartner, PA-C  Multiple Vitamins-Minerals (MULTIVITAMIN PO) Take 1 tablet by mouth daily.    Historical Provider, MD  predniSONE (DELTASONE) 50 MG tablet Take 1 tablet (50 mg total) by mouth daily. 11/04/14   Derwood KaplanAnkit Nanavati, MD   Triage Vitals: BP 155/80 mmHg  Pulse 74  Temp(Src) 98.8 F (37.1 C) (Oral)  Resp 18  SpO2 97%   Physical Exam  Constitutional: She is oriented to person, place, and time. She appears well-developed and well-nourished. No distress.  HENT:  Head: Normocephalic and atraumatic.  Eyes: Conjunctivae and EOM are normal.  Neck: Neck supple. No tracheal deviation present.  Cardiovascular: Normal rate.   Pulmonary/Chest: Effort normal. No respiratory distress. She has no wheezes. She has no rhonchi. She has no rales.  Little air movement  Musculoskeletal: Normal range of motion.  Neurological: She is alert and oriented to person, place, and time.  Skin: Skin is warm and dry.  Psychiatric: She has a normal mood and affect. Her behavior is normal.  Nursing note and vitals reviewed.   ED Course  Procedures (including critical care time)  DIAGNOSTIC STUDIES: Oxygen Saturation is 97% on RA, normal by my interpretation.  COORDINATION OF CARE: 5:36 PM-Discussed treatment plan which includes prednisone and breathing treatment with pt at bedside and pt agreed to plan.   Labs Review Labs Reviewed - No data to display  Imaging Review No results found.   EKG Interpretation None      MDM   Final diagnoses:  None    6:31 PM BP 155/80 mmHg  Pulse 74  Temp(Src) 98.8 F (37.1 C) (Oral)  Resp 18  SpO2 97% PATIENT BREATHING AND ASTHMA IMPROVED.  SHE WILL BE DISCHARGED WITH ALBUTEROL AND short burst of  prednisone. Normal oxygen saturations, no wheezing at discharge.  I personally performed the services described in this documentation, which was scribed in my presence. The recorded information has been reviewed and is accurate.        Arthor Captain, PA-C 03/22/15 1832  Arby Barrette, MD 03/26/15 [phone]

## 2015-04-02 ENCOUNTER — Encounter (HOSPITAL_COMMUNITY): Payer: Self-pay | Admitting: Emergency Medicine

## 2015-04-02 ENCOUNTER — Emergency Department (HOSPITAL_COMMUNITY)
Admission: EM | Admit: 2015-04-02 | Discharge: 2015-04-02 | Disposition: A | Discharge status: Home / Self Care | Payer: Self-pay | Attending: Emergency Medicine | Admitting: Emergency Medicine

## 2015-04-02 ENCOUNTER — Emergency Department (HOSPITAL_COMMUNITY): Payer: Self-pay

## 2015-04-02 DIAGNOSIS — R059 Cough, unspecified: Secondary | ICD-10-CM

## 2015-04-02 DIAGNOSIS — R0602 Shortness of breath: Secondary | ICD-10-CM

## 2015-04-02 DIAGNOSIS — R05 Cough: Secondary | ICD-10-CM

## 2015-04-02 DIAGNOSIS — Z79899 Other long term (current) drug therapy: Secondary | ICD-10-CM | POA: Insufficient documentation

## 2015-04-02 DIAGNOSIS — J45901 Unspecified asthma with (acute) exacerbation: Secondary | ICD-10-CM | POA: Insufficient documentation

## 2015-04-02 DIAGNOSIS — E669 Obesity, unspecified: Secondary | ICD-10-CM | POA: Insufficient documentation

## 2015-04-02 DIAGNOSIS — R0789 Other chest pain: Secondary | ICD-10-CM | POA: Insufficient documentation

## 2015-04-02 LAB — CBC
HEMATOCRIT: 37.7 % (ref 36.0–46.0)
Hemoglobin: 12.2 g/dL (ref 12.0–15.0)
MCH: 29.2 pg (ref 26.0–34.0)
MCHC: 32.4 g/dL (ref 30.0–36.0)
MCV: 90.2 fL (ref 78.0–100.0)
PLATELETS: 258 10*3/uL (ref 150–400)
RBC: 4.18 MIL/uL (ref 3.87–5.11)
RDW: 14.1 % (ref 11.5–15.5)
WBC: 8 10*3/uL (ref 4.0–10.5)

## 2015-04-02 LAB — BASIC METABOLIC PANEL
Anion gap: 5 (ref 5–15)
BUN: 11 mg/dL (ref 6–20)
CO2: 30 mmol/L (ref 22–32)
CREATININE: 0.79 mg/dL (ref 0.44–1.00)
Calcium: 9.6 mg/dL (ref 8.9–10.3)
Chloride: 109 mmol/L (ref 101–111)
GFR calc Af Amer: >60 mL/min (ref >60)
Glucose, Bld: 168 mg/dL — ABNORMAL HIGH (ref 65–99)
POTASSIUM: 3.7 mmol/L (ref 3.5–5.1)
Sodium: 144 mmol/L (ref 135–145)

## 2015-04-02 LAB — I-STAT TROPONIN, ED: Troponin i, poc: 0 ng/mL (ref 0.00–0.08)

## 2015-04-02 MED ORDER — PREDNISONE 20 MG PO TABS: 40.0000 mg | ORAL_TABLET | Freq: Every day | ORAL | Status: DC

## 2015-04-02 MED ORDER — IPRATROPIUM BROMIDE 0.02 % IN SOLN
0.5000 mg | Freq: Once | RESPIRATORY_TRACT | Status: AC
  Administered 2015-04-02: 0.5 mg via RESPIRATORY_TRACT
  Filled 2015-04-02: qty 2.5

## 2015-04-02 MED ORDER — ALBUTEROL SULFATE (2.5 MG/3ML) 0.083% IN NEBU
5.0000 mg | INHALATION_SOLUTION | Freq: Once | RESPIRATORY_TRACT | Status: AC
  Administered 2015-04-02: 5 mg via RESPIRATORY_TRACT
  Filled 2015-04-02: qty 6

## 2015-04-02 MED ORDER — ALBUTEROL SULFATE HFA 108 (90 BASE) MCG/ACT IN AERS
2.0000 | INHALATION_SPRAY | Freq: Once | RESPIRATORY_TRACT | Status: AC
  Administered 2015-04-02: 2 via RESPIRATORY_TRACT
  Filled 2015-04-02: qty 6.7

## 2015-04-02 NOTE — ED Notes (Signed)
Pt states that her chest pain is gone and chest tightness is almost completely gone following breathing Tx

## 2015-04-02 NOTE — ED Notes (Signed)
Per pt, states right chest pain with inhalation for 2 days-states she just got over URI

## 2015-04-02 NOTE — ED Notes (Signed)
Pt feels this is an asthma issue as she was seen at [facility] last week for asthma issue, given Rx for steroids and did not fill them.

## 2015-04-02 NOTE — Discharge Instructions (Signed)
1. Medications: albuterol inhaler as needed, steroid, usual home medications 2. Treatment: rest, drink plenty of fluids 3. Follow Up: please followup with your primary doctor this week for discussion of your diagnoses and further evaluation after today's visit; if you do not have a primary care doctor use the resource guide provided to find one; please return to the ER for high fever, significant chest pain or shortness of breath, new or worsening symptoms   Emergency Department Resource Guide 1) Find a Doctor and Pay Out of Pocket Although you won't have to find out who is covered by your insurance plan, it is a good idea to ask around and get recommendations. You will then need to call the office and see if the doctor you have chosen will accept you as a new patient and what types of options they offer for patients who are self-pay. Some doctors offer discounts or will set up payment plans for their patients who do not have insurance, but you will need to ask so you aren't surprised when you get to your appointment.  2) Contact Your Local Health Department Not all health departments have doctors that can see patients for sick visits, but many do, so it is worth a call to see if yours does. If you don't know where your local health department is, you can check in your phone book. The CDC also has a tool to help you locate your state's health department, and many state websites also have listings of all of their local health departments.  3) Find a Walk-in Clinic If your illness is not likely to be very severe or complicated, you may want to try a walk in clinic. These are popping up all over the country in pharmacies, drugstores, and shopping centers. They're usually staffed by nurse practitioners or physician assistants that have been trained to treat common illnesses and complaints. They're usually fairly quick and inexpensive. However, if you have serious medical issues or chronic medical problems,  these are probably not your best option.  No Primary Care Doctor: - Call Health Connect at  [phone] - they can help you locate a primary care doctor that  accepts your insurance, provides certain services, etc. - Physician Referral Service- [phone]  Chronic Pain Problems: Organization         Address  Phone   Notes  [facility]  [phone] Patients need to be referred by their primary care doctor.   Medication Assistance: Organization         Address  Phone   Notes  [facility] [address] [place], [place] [address] [phone] --Must be a resident of [facility] [place] -- Must have NO insurance coverage whatsoever (no Medicaid/ Medicare, etc.) -- The pt. MUST have a primary care doctor that directs their care regularly and follows them in the community   MedAssist  [phone]   [employer]  [phone]    Agencies that provide inexpensive medical care: Organization         Address  Phone   Notes  [facility]  [phone]   [facility]    [phone]   [facility] [address] [place], [place] [address] [phone]   [facility] [address], [place] [phone]   Planned Parenthood    [phone]   [facility]    [phone]  [facility] [place]  [address], [place] Phone:  [phone], Fax:  [phone] Hours of Operation:  9 am - 6 pm, M-F.  Also accepts Medicaid/Medicare and self-pay.  [facility] [place], [address], [place] Phone: [phone], Fax: [phone]. Hours of Operation:  8:30 am - 5:30 pm, M-F.  Also accepts Medicaid and self-pay.  [facility] [address] [place] Phone: [phone]   [place], [place], [place] [phone], Ext. [phone] Mondays &  Thursdays: 7-9 AM.  First 15 patients are seen on a first come, first serve basis.    [place] Providers:  Organization         Address  Phone   Notes  [facility] [address], [place] [phone] Also accepts self-pay patients.  [facility] [address], [place]  [phone]   [place], [address], [place] [phone]   [facility] [address], [place] [phone]   Lucianne Lei [address], [place]   [phone] Only accepts [place] Access [place] patients after they have their name applied to their card.   Self-Pay (no insurance) in [facility]:  Organization         Address  Phone   Notes  Sickle Cell Patients, [facility] [place] [phone]   [facility] [place] [phone]   [facility] [place]  [place], [place], [place] [phone]   [facility]/Dr. Osei-Bonsu  [address] [place] or [address], [place] [phone] Phone number for both [place] and [place] locations is the same.  [facility] [address] [place] [phone]   [facility] [address] [place] or [address] [phone]   [facility] [address] [place] [phone], phone; [phone], fax Sees patients 1st and 3rd Saturday of every month.  Must not qualify for public or private insurance (i.e. Medicaid, Medicare, [place] Health Choice, Veterans' Benefits)  Household income should be no more than 200% of the poverty level The clinic cannot treat you if you are pregnant or think you are pregnant  Sexually transmitted diseases are not treated at the  clinic.    Dental Care: Organization         Address  Phone  Notes  [facility] Department of [facility] [place] [phone] Accepts children up to age 19 who are enrolled in [place] or [place]; pregnant women with a Medicaid card; and children who have applied for Medicaid or [place] Health Choice, but were declined, whose parents can pay a reduced fee at time of service.  [facility] Department of [facility]  [address], [place] [phone] Accepts children up to age 62 who are enrolled in [place] or [place]; pregnant women with a Medicaid card; and children who have applied for Medicaid or [place] Health Choice, but were declined, whose parents can pay a reduced fee at time of service.  [place]  [phone]  [place] [phone] Patients are seen by appointment only. Walk-ins are not accepted. [place] will see patients 46 years of age and older. Monday - Tuesday (8am-5pm) Most Wednesdays (8:30-5pm) $30 per visit, cash only  [facility] Adult Dental Access PROGRAM  [address], [facility] [phone] Patients are seen by appointment only. Walk-ins are not accepted. [place] will see patients 10 years of age and older. One Wednesday Evening (Monthly: Volunteer Based).  $30 per visit, cash only  [place]  [phone] for adults; Children under age 30, call Graduate Pediatric Dentistry at [phone]. Children aged 7-14, please call [phone] to request a pediatric application.  Dental services are provided in all areas of dental care including fillings, crowns and bridges, complete and partial dentures, implants, gum treatment, root canals, and extractions. Preventive care is also provided. Treatment is provided to both adults and children. Patients are selected via a lottery and there is often a  waiting list.   [facility] [address], [place]  [phone] [email or web address]   [facility] [address] [place], [place] [phone], Ext. [phone] Second and Fourth Thursday of each month, opens at 6:30 AM; Clinic ends at 9 AM.  Patients are seen on a first-come first-served basis, and a limited number are seen during each clinic.   [facility]  [address] Hillard Danker [place], [place] [phone]   Eligibility Requirements You must have lived in [place], [place], or [place] counties for at least the last three months.   You cannot be eligible for state or federal sponsored [employer], including [employer], [place], or [employer].   You generally cannot be eligible for healthcare insurance through your employer.    How to apply: Eligibility screenings are held every Tuesday and Wednesday afternoon from 1:00 pm until 4:00 pm. You do not need an appointment for the interview!  [facility] [address] [place], [place]   [place]  [place] Department  [place]  [phone]    Behavioral Health Resources in the Community: Intensive Outpatient Programs Organization         Address  Phone  Notes  [place]. [address] [place], [place] [phone]   [facility] [address] [place], [place]   ADS: Alcohol & Drug Svcs [address] [place], [place]   [place] [address]  [place], [place] or [phone]   Substance Abuse Resources Organization         Address  Phone  Notes  Alcohol and Drug Services  [phone]   [place]  [phone]   The [place]   Chinita Pester  [phone]   Residential & Outpatient Substance Abuse  Program  [phone]   Psychological Services Organization         Address  Phone  Notes  [facility] [place]  [place]  [phone]   [place] [address], [place] or [phone]    Mobile Crisis Teams Organization         Address  Phone  Notes  [facility]rapeutic Alternatives, Mobile Crisis Care Unit  [phone]   [facility] Psychotherapeutic Services  [address] [place], [place]  [facility] [address] [place] [phone]    Self-Help/Support Groups Organization         Address  Phone             Notes  [facility] [place] - variety of support groups  [place] Call for more information  Narcotics Anonymous (NA), [facility]aring [facility] [address], [employer] [place]  2 meetings at this location   Special educational needs teacher         Address  Phone  Notes  [facility] [place],    [place]  [phone]   [facility]  [address], [place] [address], [place], [place]   [place], [place] [phone] Admissions: 8am-3pm M-F  Incentives Substance [place] [address]    [place], [place] [phone]   [facility] [address] [place], [place], [place]   [facility] [address],  [place], [place]   [facility]ight Programs - Intensive Outpatient [address] Kristeen [address], [place], [place]   [facility] ([place].,  [place], [place] [phone] or [phone]   [facility] (RTS) [address], [place], [place] Accepts Medicaid  [facility] [place] [address]  [place] [phone] Substance Abuse/Addiction Treatment   [facility] Organization          Address  Phone  Notes  [facility]  [phone]   Domenic Schwab, PhD [address] [place], [place]   [phone] or [phone]   [place], [place] [phone]   [facility] [phone], [place], [place] [phone] Insurance/Medicaid/sponsorship through [facility] and Families [address] [place]                                    [place], [place] [phone] [place] [address], [place] [phone]    Dr. Adele Schilder  [phone]   [facility] Dept. 1) [address] [place] 2) [place] 3)  [place] [address], [place] [phone]  [phone]   [place] [phone] or [phone] (After Hours)

## 2015-04-02 NOTE — ED Notes (Signed)
Delay in lab draw, pt in exray 

## 2015-04-02 NOTE — ED Provider Notes (Signed)
CSN: [id number]     Arrival date & time 04/02/15  1425 History   First MD Initiated Contact with Patient 04/02/15 1705     Chief Complaint  Patient presents with  . Chest Pain    HPI   Joan Fisher is a 54 y.o. female with a PMH of asthma and obesity who presents to the ED with right sided chest pain and shortness of breath x 1 week. She states her symptoms are consistent with the chest pain and shortness of breath she experiences with asthma. She denies exacerbating factors. She states she has tried her home albuterol inhaler, which has provided temporary symptom relief. She was evaluated in the ED 10/27 for the same symptoms, and states she never picked up her prescribed steroid. She denies fever, chills, headache, lightheadedness, dizziness, abdominal pain, N/V/D/C, weakness, numbness, parestheshia. She reports improvement in her cough. She denies recent travel or immobility, recent surgery, history of malignancy, history of DVT/PE, estrogen use.   Past Medical History  Diagnosis Date  . Asthma   . Obesity    Past Surgical History  Procedure Laterality Date  . Shoulder surgery    . Dislocated shoulder repair      r   No family history on file. Social History  Substance Use Topics  . Smoking status: Never Smoker   . Smokeless tobacco: Never Used  . Alcohol Use: Yes     Comment: wine occasional   OB History    No data available      Review of Systems  Constitutional: Negative for fever and chills.  HENT: Positive for congestion.   Respiratory: Positive for cough and shortness of breath.   Cardiovascular: Positive for chest pain. Negative for leg swelling.  Gastrointestinal: Negative for nausea, vomiting, abdominal pain, diarrhea and constipation.  Neurological: Negative for dizziness, weakness, light-headedness, numbness and headaches.  All other systems reviewed and are negative.     Allergies  Percocet  Home Medications   Prior to Admission medications    Medication Sig Start Date End Date Taking? Authorizing Provider  albuterol (PROVENTIL HFA;VENTOLIN HFA) 108 (90 BASE) MCG/ACT inhaler Inhale 1-2 puffs into the lungs every 6 (six) hours as needed for wheezing or shortness of breath. 09/13/13  Yes Jillyn Ledger, PA-C  Calcium Carbonate-Vitamin D (CALTRATE 600+D PO) Take 1 tablet by mouth daily.   Yes Historical Provider, MD  Multiple Vitamins-Minerals (MULTIVITAMIN PO) Take 1 tablet by mouth daily.   Yes Historical Provider, MD  ibuprofen (ADVIL,MOTRIN) 800 MG tablet Take 1 tablet (800 mg total) by mouth 3 (three) times daily. Patient not taking: Reported on 04/02/2015 11/18/14   Joycie Peek, PA-C  predniSONE (DELTASONE) 20 MG tablet Take 2 tablets (40 mg total) by mouth daily. 04/02/15   Rashauna Tep C [place] Vert, PA-C    BP 136/60 mmHg  Pulse 66  Temp(Src) 98.2 F (36.8 C) (Oral)  Resp 16  SpO2 100% Physical Exam  Constitutional: She is oriented to person, place, and time. She appears well-developed and well-nourished. No distress.  HENT:  Head: Normocephalic and atraumatic.  Right Ear: External ear normal.  Left Ear: External ear normal.  Nose: Nose normal.  Mouth/Throat: Uvula is midline, oropharynx is clear and moist and mucous membranes are normal.  Eyes: Conjunctivae, EOM and lids are normal. Pupils are equal, round, and reactive to light. Right eye exhibits no discharge. Left eye exhibits no discharge. No scleral icterus.  Neck: Normal range of motion. Neck supple.  Cardiovascular: Normal rate, regular rhythm,  normal heart sounds, intact distal pulses and normal pulses.   Pulmonary/Chest: Effort normal and breath sounds normal. No respiratory distress. She has no wheezes. She has no rales. She exhibits tenderness.  Mild TTP to right anterior chest wall under right breast, patient reports this reproduces her pain.  Abdominal: Soft. Normal appearance and bowel sounds are normal. She exhibits no distension and no mass. There is no  tenderness. There is no rigidity, no rebound and no guarding.  Musculoskeletal: Normal range of motion. She exhibits no edema or tenderness.  Neurological: She is alert and oriented to person, place, and time. She has normal strength. No sensory deficit.  Skin: Skin is warm, dry and intact. No rash noted. She is not diaphoretic. No erythema. No pallor.  Psychiatric: She has a normal mood and affect. Her speech is normal and behavior is normal.  Nursing note and vitals reviewed.   ED Course  Procedures (including critical care time)  Labs Review Labs Reviewed  BASIC METABOLIC PANEL - Abnormal; Notable for the following:    Glucose, Bld 168 (*)    All other components within normal limits  CBC  I-STAT TROPOININ, ED    Imaging Review Dg Chest 2 View  04/02/2015  CLINICAL DATA:  Chest pain and shortness of breath for 2 days. EXAM: CHEST  2 VIEW COMPARISON:  11/04/2014 FINDINGS: The heart size and mediastinal contours are within normal limits. Both lungs are clear. The visualized skeletal structures are unremarkable. IMPRESSION: No active cardiopulmonary disease. Electronically Signed   By: Charlett NoseKevin  Dover M.D.   On: 04/02/2015 14:57   I have personally reviewed and evaluated these images and lab results as part of my medical decision-making.   EKG Interpretation   Date/Time:  Monday April 02 2015 14:42:45 EST Ventricular Rate:  98 PR Interval:  167 QRS Duration: 75 QT Interval:  342 QTC Calculation: 437 R Axis:   27 Text Interpretation:  Normal sinus rhythm Early transition When compared  with ECG of 11/04/2014, No significant change was found Confirmed by [facility]   MD, DAVID ([id number]) on 04/02/2015 6:06:36 PM      MDM   Final diagnoses:  Cough  Shortness of breath  Chest tightness    54 year old female presents with right-sided chest pain and shortness of breath for the past week. She states her symptoms are typical of her asthma. She reports she was seen 10/27 for the same  symptoms, and was discharged with steroids, which she did not fill. Denies fever, chills, headache, lightheadedness, dizziness, abdominal pain, N/V/D/C, weakness, numbness, parestheshia. Reports improvement in her cough. Denies recent travel or immobility, recent surgery, history of malignancy, history of DVT/PE, estrogen use.  Patient is afebrile. Vital signs stable. Heart regular rate and rhythm. Lungs clear to auscultation bilaterally. Mild tenderness palpation of right chest wall under right breast. Abdomen soft, nontender, nondistended. No lower extremity edema.  Will give breathing treatment.  CBC negative for leukocytosis or anemia. BMP unremarkable. EKG normal sinus rhythm. Troponin negative. Chest x-ray no active cardiopulmonary disease.  Patient reports significant improvement in symptoms s/p breathing treatment. Given duration of symptoms and negative workup in the ED, low suspicion for ACS. No significant risk factors for PE. Symptoms likely related to patient's asthma. She is well-appearing, feel she is stable for discharge at this time. Will treat with albuterol inhaler and short course of steroids. Return precautions discussed. Patient to follow up with PCP, resource list given. Patient verbalizes her understanding and is in agreement with plan.  BP 136/60 mmHg  Pulse 66  Temp(Src) 98.2 F (36.8 C) (Oral)  Resp 16  SpO2 100%      Mady Gemma, PA-C 04/02/15 2113  [name], MD 04/03/15 [phone]

## 2015-04-02 NOTE — ED Notes (Signed)
Pt would like to wait on the IV at this time.

## 2015-04-02 NOTE — Progress Notes (Signed)
CM spoke with pt who confirms uninsured [place] resident with no pcp.  CM discussed and provided written information for uninsured accepting pcps, discussed the importance of pcp vs EDP services for f/u care, www.needymeds.org, www.goodrx.com, discounted pharmacies and other [place] resources such as [facility] , [facility], affordable care act, financial assistance, uninsured dental services, [place] med assist, DSS and  health department  Reviewed resources for [place] uninsured accepting pcps like [place], family medicine at [address], [facility], [facility], local urgent care centers, [facility], [facility], general medical clinics, [facility] [place], [facility] plus others, medication resources, [facility] out patient pharmacies and housing Pt voiced understanding and appreciation of resources provided   Provided [facility] contact information Pt agreed to a referral Cm completed referral Pt to be contact by [facility] clinical liason  

## 2015-12-26 ENCOUNTER — Encounter (HOSPITAL_COMMUNITY): Payer: Self-pay | Admitting: Emergency Medicine

## 2015-12-26 ENCOUNTER — Emergency Department (HOSPITAL_COMMUNITY)
Admission: EM | Admit: 2015-12-26 | Discharge: 2015-12-26 | Disposition: A | Discharge status: Home / Self Care | Payer: Self-pay | Attending: Emergency Medicine | Admitting: Emergency Medicine

## 2015-12-26 DIAGNOSIS — K0889 Other specified disorders of teeth and supporting structures: Secondary | ICD-10-CM | POA: Insufficient documentation

## 2015-12-26 DIAGNOSIS — J45909 Unspecified asthma, uncomplicated: Secondary | ICD-10-CM | POA: Insufficient documentation

## 2015-12-26 DIAGNOSIS — Z791 Long term (current) use of non-steroidal anti-inflammatories (NSAID): Secondary | ICD-10-CM | POA: Insufficient documentation

## 2015-12-26 MED ORDER — PENICILLIN V POTASSIUM 500 MG PO TABS: 500.0000 mg | ORAL_TABLET | Freq: Four times a day (QID) | ORAL | 0 refills | Status: AC

## 2015-12-26 MED ORDER — LIDOCAINE VISCOUS 2 % MT SOLN: 15.0000 mL | OROMUCOSAL | 0 refills | Status: DC | PRN

## 2015-12-26 NOTE — Discharge Instructions (Signed)
You have a dental injury. It is very important that you get evaluated by a dentist as soon as possible. Call tomorrow to schedule an appointment. Alternate between Tylenol and Ibuprofen as needed for pain. Viscous lidocaine as directed for severe pain - swish this around your mouth and spit it out. Take your full course of antibiotics. Read the instructions below.  Eat a soft or liquid diet and rinse your mouth out after meals with warm water. You should see a dentist or return here at once if you have increased swelling, increased pain or uncontrolled bleeding from the site of your injury.  SEEK MEDICAL CARE IF:  You have increased pain not controlled with medicines.  You have swelling around your tooth, in your face or neck.  You have bleeding which starts, continues, or gets worse.  You have a fever >101 If you are unable to open your mouth

## 2015-12-26 NOTE — ED Provider Notes (Signed)
[facility]-EMERGENCY DEPT Provider Note   CSN: [id number] Arrival date & time: 12/26/15  1319  First Provider Contact:  First MD Initiated Contact with Patient 12/26/15 1328     By signing my name below, I, Rosario Adie, attest that this documentation has been prepared under the direction and in the presence of [facility], PA-C.  Electronically Signed: Rosario Adie, ED Scribe. 12/26/15. 1:36 PM.  History   Chief Complaint Chief Complaint  Patient presents with  . Dental Pain   The history is provided by the patient. No language interpreter was used.   HPI Comments: Joan Fisher is a 55 y.o. female with a PMHx asthma and obesity, who presents to the Emergency Department complaining of gradual onset, intermittent, right-sided, upper, 7/10 dental pain x 6 months, with this episode worsening yesterday. She reports a hx of similar dental pain to the area after chipping the tooth ~2 years ago. Pt notes that she has been gargling with Listerine, applying vinegar, and taking Tylenol w/ moderate relief of her pain, but notes that it always returns. Her pain is worsened w/ chewing and eating foods. Denies trouble breathing, swallowing, fevers, or any other problems.   Past Medical History:  Diagnosis Date  . Asthma   . Obesity    There are no active problems to display for this patient.  Past Surgical History:  Procedure Laterality Date  . dislocated shoulder repair     r  . SHOULDER SURGERY     OB History    No data available     Home Medications    Prior to Admission medications   Medication Sig Start Date End Date Taking? Authorizing Provider  albuterol (PROVENTIL HFA;VENTOLIN HFA) 108 (90 BASE) MCG/ACT inhaler Inhale 1-2 puffs into the lungs every 6 (six) hours as needed for wheezing or shortness of breath. 09/13/13   Jillyn Ledger, PA-C  Calcium Carbonate-Vitamin D (CALTRATE 600+D PO) Take 1 tablet by mouth daily.    Historical Provider, MD  ibuprofen  (ADVIL,MOTRIN) 800 MG tablet Take 1 tablet (800 mg total) by mouth 3 (three) times daily. Patient not taking: Reported on 04/02/2015 11/18/14   Joycie Peek, PA-C  lidocaine (XYLOCAINE) 2 % solution Use as directed 15 mLs in the mouth or throat as needed for mouth pain. 12/26/15   Chase Picket Ward, PA-C  Multiple Vitamins-Minerals (MULTIVITAMIN PO) Take 1 tablet by mouth daily.    Historical Provider, MD  penicillin v potassium (VEETID) 500 MG tablet Take 1 tablet (500 mg total) by mouth 4 (four) times daily. 12/26/15 01/02/16  Chase Picket Ward, PA-C  predniSONE (DELTASONE) 20 MG tablet Take 2 tablets (40 mg total) by mouth daily. 04/02/15   Mady Gemma, PA-C   Family History No family history on file.  Social History Social History  Substance Use Topics  . Smoking status: Never Smoker  . Smokeless tobacco: Never Used  . Alcohol use Yes     Comment: wine occasional   Allergies   Percocet [oxycodone-acetaminophen]  Review of Systems Review of Systems  Constitutional: Negative for fever.  Respiratory: Negative for shortness of breath and wheezing.     Physical Exam Updated Vital Signs BP 152/76   Pulse 72   Temp 98.4 F (36.9 C) (Oral)   Resp 18   SpO2 99%   Physical Exam  Constitutional: She is oriented to person, place, and time. She appears well-developed and well-nourished. No distress.  HENT:  Head: Normocephalic and atraumatic.  Mouth/Throat:  Dental cavities and poor oral dentition noted, pain along tooth as depicted in image, midline uvula, no trismus, oropharynx moist and clear, no abscess noted, no oropharyngeal erythema or edema, neck supple and no tenderness. No facial edema   Cardiovascular: Normal rate, regular rhythm, normal heart sounds and intact distal pulses.  Exam reveals no gallop and no friction rub.   No murmur heard. Pulmonary/Chest: Effort normal and breath sounds normal. No respiratory distress. She has no wheezes. She has no rales. She  exhibits no tenderness.  Abdominal: Soft. Bowel sounds are normal. She exhibits no distension. There is no tenderness.  Musculoskeletal: She exhibits no edema.  Neurological: She is alert and oriented to person, place, and time.  Skin: Skin is warm and dry.  Nursing note and vitals reviewed.  ED Treatments / Results  DIAGNOSTIC STUDIES: Oxygen Saturation is 99% on RA, normal by my interpretation.  COORDINATION OF CARE: 1:36 PM-Discussed next steps with pt. Pt verbalized understanding and is agreeable with the plan.   Procedures Procedures (including critical care time)  Medications Ordered in ED Medications - No data to display  Initial Impression / Assessment and Plan / ED Course  I have reviewed the triage vital signs and the nursing notes.  Pertinent labs & imaging results that were available during my care of the patient were reviewed by me and considered in my medical decision making (see chart for details).  Clinical Course    Patient with dentalgia. No abscess requiring immediate incision and drainage. Patient is afebrile, non toxic appearing, and swallowing secretions well. Exam not concerning for Ludwig's angina or pharyngeal abscess. Will treat with PenVk and viscous lido for symptomatic relief. Alternate tylenol/ibuprofen. I stressed the importance of dental follow up for ultimate management of dental pain. Patient voices understanding and is agreeable to plan.  Final Clinical Impressions(s) / ED Diagnoses   Final diagnoses:  Pain, dental    New Prescriptions New Prescriptions   LIDOCAINE (XYLOCAINE) 2 % SOLUTION    Use as directed 15 mLs in the mouth or throat as needed for mouth pain.   PENICILLIN V POTASSIUM (VEETID) 500 MG TABLET    Take 1 tablet (500 mg total) by mouth 4 (four) times daily.   I personally performed the services described in this documentation, which was scribed in my presence. The recorded information has been reviewed and is accurate.      [facility] Ward, PA-C 12/26/15 1346    Azalia Bilis, MD 12/28/15 [phone]

## 2015-12-26 NOTE — ED Triage Notes (Signed)
Pt c/o R upper dental pain since yesterday, hx of the same. Pt does not have dental coverage. Pt c/o sensitivity to food and water and air. A&Ox4 and ambulatory. Denies any other symptoms.

## 2016-01-27 ENCOUNTER — Emergency Department (HOSPITAL_COMMUNITY)
Admission: EM | Admit: 2016-01-27 | Discharge: 2016-01-27 | Disposition: A | Discharge status: Home / Self Care | Payer: Self-pay | Attending: Emergency Medicine | Admitting: Emergency Medicine

## 2016-01-27 ENCOUNTER — Encounter (HOSPITAL_COMMUNITY): Payer: Self-pay | Admitting: Emergency Medicine

## 2016-01-27 DIAGNOSIS — J45909 Unspecified asthma, uncomplicated: Secondary | ICD-10-CM | POA: Insufficient documentation

## 2016-01-27 DIAGNOSIS — Z791 Long term (current) use of non-steroidal anti-inflammatories (NSAID): Secondary | ICD-10-CM | POA: Insufficient documentation

## 2016-01-27 DIAGNOSIS — Z79899 Other long term (current) drug therapy: Secondary | ICD-10-CM | POA: Insufficient documentation

## 2016-01-27 DIAGNOSIS — Z7952 Long term (current) use of systemic steroids: Secondary | ICD-10-CM | POA: Insufficient documentation

## 2016-01-27 DIAGNOSIS — K029 Dental caries, unspecified: Secondary | ICD-10-CM | POA: Insufficient documentation

## 2016-01-27 MED ORDER — NAPROXEN 375 MG PO TABS: 375.0000 mg | ORAL_TABLET | Freq: Two times a day (BID) | ORAL | 0 refills | Status: DC

## 2016-01-27 MED ORDER — PENICILLIN V POTASSIUM 500 MG PO TABS
500.0000 mg | ORAL_TABLET | Freq: Once | ORAL | Status: AC
  Administered 2016-01-27: 500 mg via ORAL
  Filled 2016-01-27: qty 1

## 2016-01-27 MED ORDER — PENICILLIN V POTASSIUM 500 MG PO TABS: 500.0000 mg | ORAL_TABLET | Freq: Four times a day (QID) | ORAL | 0 refills | Status: AC

## 2016-01-27 MED ORDER — NAPROXEN 500 MG PO TABS
500.0000 mg | ORAL_TABLET | Freq: Once | ORAL | Status: AC
  Administered 2016-01-27: 500 mg via ORAL
  Filled 2016-01-27: qty 1

## 2016-01-27 NOTE — ED Provider Notes (Signed)
[facility]-EMERGENCY DEPT Provider Note   CSN: [id number] Arrival date & time: 01/27/16  0548     History   Chief Complaint Chief Complaint  Patient presents with  . Dental Pain    HPI Joan Fisher is a 55 y.o. female.  The history is provided by the patient.  Dental Pain   This is a recurrent problem. The current episode started more than 1 week ago. The problem occurs constantly. The problem has been gradually worsening. The pain is severe. She has tried nothing for the symptoms. The treatment provided no relief.    Past Medical History:  Diagnosis Date  . Asthma   . Obesity     There are no active problems to display for this patient.   Past Surgical History:  Procedure Laterality Date  . dislocated shoulder repair     r  . SHOULDER SURGERY      OB History    No data available       Home Medications    Prior to Admission medications   Medication Sig Start Date End Date Taking? Authorizing Provider  albuterol (PROVENTIL HFA;VENTOLIN HFA) 108 (90 BASE) MCG/ACT inhaler Inhale 1-2 puffs into the lungs every 6 (six) hours as needed for wheezing or shortness of breath. 09/13/13   Jillyn LedgerJessica K Palmer, PA-C  Calcium Carbonate-Vitamin D (CALTRATE 600+D PO) Take 1 tablet by mouth daily.    Historical Provider, MD  ibuprofen (ADVIL,MOTRIN) 800 MG tablet Take 1 tablet (800 mg total) by mouth 3 (three) times daily. Patient not taking: Reported on 04/02/2015 11/18/14   Joycie PeekBenjamin Cartner, PA-C  lidocaine (XYLOCAINE) 2 % solution Use as directed 15 mLs in the mouth or throat as needed for mouth pain. 12/26/15   Chase PicketJaime Pilcher Ward, PA-C  Multiple Vitamins-Minerals (MULTIVITAMIN PO) Take 1 tablet by mouth daily.    Historical Provider, MD  predniSONE (DELTASONE) 20 MG tablet Take 2 tablets (40 mg total) by mouth daily. 04/02/15   Mady GemmaElizabeth C Westfall, PA-C    Family History No family history on file.  Social History Social History  Substance Use Topics  . Smoking status: Never  Smoker  . Smokeless tobacco: Never Used  . Alcohol use Yes     Comment: wine occasional     Allergies   Percocet [oxycodone-acetaminophen]   Review of Systems Review of Systems  HENT: Positive for dental problem. Negative for drooling and facial swelling.   All other systems reviewed and are negative.    Physical Exam Updated Vital Signs BP 164/85 (BP Location: Left Arm)   Pulse 88   Temp 98.8 F (37.1 C) (Oral)   Resp 18   Ht 5\' 1"  (1.549 m)   Wt 270 lb (122.5 kg)   SpO2 95%   BMI 51.02 kg/m   Physical Exam  Constitutional: She is oriented to person, place, and time. She appears well-developed and well-nourished. No distress.  HENT:  Head: Normocephalic and atraumatic.  Mouth/Throat: Uvula is midline and oropharynx is clear and moist. No oropharyngeal exudate.    Eyes: Pupils are equal, round, and reactive to light.  Neck: Normal range of motion. Neck supple.  Cardiovascular: Normal rate, regular rhythm and normal heart sounds.   Pulmonary/Chest: Effort normal and breath sounds normal. No stridor. She has no wheezes. She has no rales.  Abdominal: Soft. She exhibits no mass. There is no tenderness. There is no rebound and no guarding.  Musculoskeletal: Normal range of motion.  Lymphadenopathy:    She has no cervical  adenopathy.  Neurological: She is alert and oriented to person, place, and time.  Skin: Skin is warm. Capillary refill takes less than 2 seconds.  Psychiatric: She has a normal mood and affect.     ED Treatments / Results  Labs (all labs ordered are listed, but only abnormal results are displayed) Labs Reviewed - No data to display  EKG  EKG Interpretation None       Radiology No results found.  Procedures Procedures (including critical care time)  Medications Ordered in ED Medications  naproxen (NAPROSYN) tablet 500 mg (not administered)  penicillin v potassium (VEETID) tablet 500 mg (not administered)     Initial Impression /  Assessment and Plan / ED Course  I have reviewed the triage vital signs and the nursing notes.  Pertinent labs & imaging results that were available during my care of the patient were reviewed by me and considered in my medical decision making (see chart for details).  Clinical Course    Vitals:   01/27/16 0553  BP: 164/85  Pulse: 88  Resp: 18  Temp: 98.8 F (37.1 C)   Medications  naproxen (NAPROSYN) tablet 500 mg (not administered)  penicillin v potassium (VEETID) tablet 500 mg (not administered)      Final Clinical Impressions(s) / ED Diagnoses   Final diagnoses:  None    New Prescriptions New Prescriptions   No medications on file  Penicillin and follow up with a dentist for extraction.  All questions answered to patient's satisfaction. Based on history and exam patient has been appropriately medically screened and emergency conditions excluded. Patient is stable for discharge at this time. Follow up with your PMD for recheck in 2 days and strict return precautions given.    Cy Blamer, MD 01/27/16 [phone]

## 2016-01-27 NOTE — ED Triage Notes (Signed)
Pt c/o R upper dental pain with swelling x 1 week. Denies fever, denies n/v/d.

## 2016-03-30 ENCOUNTER — Emergency Department (HOSPITAL_COMMUNITY)
Admission: EM | Admit: 2016-03-30 | Discharge: 2016-03-30 | Disposition: A | Discharge status: Home / Self Care | Payer: Self-pay | Attending: Emergency Medicine | Admitting: Emergency Medicine

## 2016-03-30 DIAGNOSIS — J029 Acute pharyngitis, unspecified: Secondary | ICD-10-CM | POA: Insufficient documentation

## 2016-03-30 DIAGNOSIS — J45901 Unspecified asthma with (acute) exacerbation: Secondary | ICD-10-CM | POA: Insufficient documentation

## 2016-03-30 LAB — RAPID STREP SCREEN (MED CTR MEBANE ONLY): STREPTOCOCCUS, GROUP A SCREEN (DIRECT): NEGATIVE

## 2016-03-30 MED ORDER — BENZONATATE 100 MG PO CAPS: 100.0000 mg | ORAL_CAPSULE | Freq: Three times a day (TID) | ORAL | 0 refills | Status: DC

## 2016-03-30 MED ORDER — ALBUTEROL SULFATE HFA 108 (90 BASE) MCG/ACT IN AERS: 1.0000 | INHALATION_SPRAY | Freq: Four times a day (QID) | RESPIRATORY_TRACT | 0 refills | Status: DC | PRN

## 2016-03-30 MED ORDER — IPRATROPIUM-ALBUTEROL 0.5-2.5 (3) MG/3ML IN SOLN
3.0000 mL | Freq: Once | RESPIRATORY_TRACT | Status: AC
  Administered 2016-03-30: 3 mL via RESPIRATORY_TRACT
  Filled 2016-03-30: qty 3

## 2016-03-30 MED ORDER — PREDNISONE 20 MG PO TABS
40.0000 mg | ORAL_TABLET | Freq: Once | ORAL | Status: AC
  Administered 2016-03-30: 40 mg via ORAL
  Filled 2016-03-30: qty 2

## 2016-03-30 MED ORDER — PREDNISONE 20 MG PO TABS: 40.0000 mg | ORAL_TABLET | Freq: Every day | ORAL | 0 refills | Status: DC

## 2016-03-30 NOTE — Discharge Instructions (Signed)
This was likely a mild asthma exacerbation. Please continue taking the prednisone or 4 days. He may use albuterol inhaler as needed.Please take the Tessalon as needed for cough. You need to follow up with primary care doctor had given you a financial resource guide. Return to the ED if your symptoms worsen.

## 2016-03-30 NOTE — ED Triage Notes (Signed)
Pt reports asthma flare up since yesterday. Used home nebulizer without relief. Also having sore throat. No audible wheezing. Speaking in full sentences.

## 2016-03-30 NOTE — ED Provider Notes (Signed)
[facility]-EMERGENCY DEPT Provider Note   CSN: [id number] Arrival date & time: 03/30/16  1657  By signing my name below, I, Rosario Adie, attest that this documentation has been prepared under the direction and in the presence of Demetrios Loll, PA-C.  Electronically Signed: Rosario Adie, ED Scribe. 03/30/16. 6:25 PM.  History   Chief Complaint Chief Complaint  Patient presents with  . Asthma   The history is provided by the patient. No language interpreter was used.    HPI Comments: Joan Fisher is a 55 y.o. female with a PMHx of asthma and obesity, who presents to the Emergency Department complaining of shortness of breath w/ associated wheezing onset last night. She reports associated mild cough, rhinorrhea, sore throat, chills, and subjective fever secondary to her breathing issues. Pt notes that she has a h/o asthma and that her symptoms today are typical of her flare-ups. Her last flare-up was approximately 1 year ago. Pt has been taking her at home Albuterol nebulizer treatment with mild relief, however her symptoms always worsen again after several hours. She additionally took Tylenol today with moderate relief of her sore throat, but it has since worsened as well. Seasonal changes and illnesses will typically exacerbate her asthma. Her grandchildren are currently sick with similar symptoms. Pt is currently able to tolerate their own secretions well. Denies post-nasal drip, or any other associated symptoms.   Past Medical History:  Diagnosis Date  . Asthma   . Obesity    There are no active problems to display for this patient.  Past Surgical History:  Procedure Laterality Date  . dislocated shoulder repair     r  . SHOULDER SURGERY     OB History    No data available     Home Medications    Prior to Admission medications   Medication Sig Start Date End Date Taking? Authorizing Provider  albuterol (PROVENTIL HFA;VENTOLIN HFA) 108 (90 BASE) MCG/ACT inhaler  Inhale 1-2 puffs into the lungs every 6 (six) hours as needed for wheezing or shortness of breath. 09/13/13   Jillyn Ledger, PA-C  Calcium Carbonate-Vitamin D (CALTRATE 600+D PO) Take 1 tablet by mouth daily.    Historical Provider, MD  ibuprofen (ADVIL,MOTRIN) 800 MG tablet Take 1 tablet (800 mg total) by mouth 3 (three) times daily. Patient not taking: Reported on 04/02/2015 11/18/14   Joycie Peek, PA-C  lidocaine (XYLOCAINE) 2 % solution Use as directed 15 mLs in the mouth or throat as needed for mouth pain. 12/26/15   Chase Picket Ward, PA-C  Multiple Vitamins-Minerals (MULTIVITAMIN PO) Take 1 tablet by mouth daily.    Historical Provider, MD  naproxen (NAPROSYN) 375 MG tablet Take 1 tablet (375 mg total) by mouth 2 (two) times daily. 01/27/16   April Palumbo, MD  predniSONE (DELTASONE) 20 MG tablet Take 2 tablets (40 mg total) by mouth daily. 04/02/15   Mady Gemma, PA-C   Family History No family history on file.  Social History Social History  Substance Use Topics  . Smoking status: Never Smoker  . Smokeless tobacco: Never Used  . Alcohol use Yes     Comment: wine occasional   Allergies   Percocet [oxycodone-acetaminophen]  Review of Systems Review of Systems  Constitutional: Positive for chills and fever (subjective).  HENT: Positive for rhinorrhea and sore throat. Negative for postnasal drip.   Respiratory: Positive for cough, shortness of breath and wheezing.   All other systems reviewed and are negative.  Physical Exam Updated  Vital Signs BP 174/87   Pulse 103   Temp 98.3 F (36.8 C) (Oral)   Resp 15   SpO2 99%   Physical Exam  Constitutional: She appears well-developed and well-nourished.  Pt is speaking in complete sentences.   HENT:  Head: Normocephalic and atraumatic.  Right Ear: Tympanic membrane, external ear and ear canal normal. Tympanic membrane is not erythematous and not retracted.  Left Ear: Tympanic membrane, external ear and ear canal  normal. Tympanic membrane is not erythematous and not retracted.  Nose: Mucosal edema and rhinorrhea present.  Mouth/Throat: Uvula is midline and mucous membranes are normal. No trismus in the jaw. No uvula swelling. Posterior oropharyngeal edema and posterior oropharyngeal erythema present. No oropharyngeal exudate or tonsillar abscesses. Tonsils are 1+ on the right. Tonsils are 1+ on the left. No tonsillar exudate.  Eyes: Conjunctivae are normal.  Neck: Normal range of motion. Neck supple.  Cardiovascular: Normal rate, regular rhythm, normal heart sounds and intact distal pulses.  Exam reveals no gallop and no friction rub.   No murmur heard. Heart rate is 95 on exam.   Pulmonary/Chest: Effort normal. No accessory muscle usage. No respiratory distress. She has no decreased breath sounds. She has no wheezes. She has no rhonchi. She has no rales.  Abdominal: She exhibits no distension.  Musculoskeletal: Normal range of motion.  Lymphadenopathy:    She has no cervical adenopathy.  Neurological: She is alert.  Skin: Skin is warm and dry.  Psychiatric: She has a normal mood and affect. Her behavior is normal.  Nursing note and vitals reviewed.  ED Treatments / Results  DIAGNOSTIC STUDIES: Oxygen Saturation is 99% on RA, normal by my interpretation.   COORDINATION OF CARE: 6:25 PM-Discussed next steps with pt. Pt verbalized understanding and is agreeable with the plan.   Labs (all labs ordered are listed, but only abnormal results are displayed) Labs Reviewed  RAPID STREP SCREEN (NOT AT [facility])  CULTURE, GROUP A STREP [facility])   EKG  EKG Interpretation None      Radiology No results found.  Procedures Procedures   Medications Ordered in ED Medications  ipratropium-albuterol (DUONEB) 0.5-2.5 (3) MG/3ML nebulizer solution 3 mL (3 mLs Nebulization Given 03/30/16 1833)  predniSONE (DELTASONE) tablet 40 mg (40 mg Oral Given 03/30/16 1833)   Initial Impression / Assessment and Plan  / ED Course  I have reviewed the triage vital signs and the nursing notes.  Pertinent labs & imaging results that were available during my care of the patient were reviewed by me and considered in my medical decision making (see chart for details).  Clinical Course    55yo female who presents into the ED with symptoms consistent with typical asthma exacerbation. Patient with mild signs and symptoms of asthma/RAD. Oxygen saturation is above 90%. No accessory muscle use, no wheezing on exam, no cyanosis. Strep test is negative. Likely viral pharyngitis. Treated in the ED with a DuoNeb tx. Patient feels improved after treatment. Will discharge with a short burst of steroids and Albuterol inhaler refill. Additionally recommended to continue with at home conservative therapies. Will give resources for f/u w/ wellness clinics in the area. Patient is hemodynamically stable. Pt is comfortable with above plan and is stable for discharge at this time. All questions were answered prior to disposition. Strict return precautions for f/u into the ED were discussed.   Final Clinical Impressions(s) / ED Diagnoses   Final diagnoses:  Mild asthma exacerbation  Viral pharyngitis   New Prescriptions  Discharge Medication List as of 03/30/2016  7:37 PM    START taking these medications   Details  benzonatate (TESSALON) 100 MG capsule Take 1 capsule (100 mg total) by mouth every 8 (eight) hours., Starting Sun 03/30/2016, Print       I personally performed the services described in this documentation, which was scribed in my presence. The recorded information has been reviewed and is accurate.      Rise MuKenneth T Leaphart, PA-C 03/31/16 1053    Maia PlanJoshua G Long, MD 04/01/16 1001

## 2016-04-02 ENCOUNTER — Encounter (HOSPITAL_COMMUNITY): Payer: Self-pay | Admitting: Emergency Medicine

## 2016-04-02 ENCOUNTER — Emergency Department (HOSPITAL_COMMUNITY)
Admission: EM | Admit: 2016-04-02 | Discharge: 2016-04-03 | Disposition: A | Discharge status: Home / Self Care | Payer: Self-pay | Attending: Emergency Medicine | Admitting: Emergency Medicine

## 2016-04-02 ENCOUNTER — Emergency Department (HOSPITAL_COMMUNITY): Payer: Self-pay

## 2016-04-02 DIAGNOSIS — J45909 Unspecified asthma, uncomplicated: Secondary | ICD-10-CM | POA: Insufficient documentation

## 2016-04-02 DIAGNOSIS — J209 Acute bronchitis, unspecified: Secondary | ICD-10-CM | POA: Insufficient documentation

## 2016-04-02 DIAGNOSIS — J40 Bronchitis, not specified as acute or chronic: Secondary | ICD-10-CM

## 2016-04-02 DIAGNOSIS — Z79899 Other long term (current) drug therapy: Secondary | ICD-10-CM | POA: Insufficient documentation

## 2016-04-02 LAB — CULTURE, GROUP A STREP (THRC)

## 2016-04-02 NOTE — ED Triage Notes (Signed)
Pt reports SOB and side/rib pain. Pt states she was seen 2 days ago for asthma and now pain is in her sides/ribs. Pt states she has been coughing up yellow like mucus. Pt used inhaler aprox 5 mins. Pt was able to walk to room in no distress. No wheezing noted. Pt speaking in full sentences.

## 2016-04-02 NOTE — ED Provider Notes (Signed)
[facility]-EMERGENCY DEPT Provider Note   CSN: [id number] Arrival date & time: 04/02/16  2230     History   Chief Complaint Chief Complaint  Patient presents with  . Shortness of Breath    HPI Joan Fisher is a 55 y.o. female.Complains of cough productive of clear sputum with slight yellow tinge onset 2 days ago. Other associated symptoms include mild wheeze. She feels breathing is normal since treatment with albuterol. She also complains of chest pain and bilateral chest which is worse with coughing, improved when she doesn't cough for the past 1-2 days. No nausea or vomiting. No fever. No other associated symptoms. Treated with prednisone and albuterol with partial relief .  HPI  Past Medical History:  Diagnosis Date  . Asthma   . Obesity     There are no active problems to display for this patient.   Past Surgical History:  Procedure Laterality Date  . dislocated shoulder repair     r  . SHOULDER SURGERY      OB History    No data available       Home Medications    Prior to Admission medications   Medication Sig Start Date End Date Taking? Authorizing Provider  albuterol (PROVENTIL HFA;VENTOLIN HFA) 108 (90 Base) MCG/ACT inhaler Inhale 1-2 puffs into the lungs every 6 (six) hours as needed for wheezing or shortness of breath. 03/30/16  Yes Rise MuKenneth T Leaphart, PA-C  Multiple Vitamins-Minerals (MULTIVITAMIN PO) Take 1 tablet by mouth daily.   Yes Historical Provider, MD  predniSONE (DELTASONE) 20 MG tablet Take 2 tablets (40 mg total) by mouth daily with breakfast. 03/30/16  Yes Rise MuKenneth T Leaphart, PA-C  benzonatate (TESSALON) 100 MG capsule Take 1 capsule (100 mg total) by mouth every 8 (eight) hours. Patient not taking: Reported on 04/02/2016 03/30/16   Rise MuKenneth T Leaphart, PA-C  ibuprofen (ADVIL,MOTRIN) 800 MG tablet Take 1 tablet (800 mg total) by mouth 3 (three) times daily. Patient not taking: Reported on 04/02/2015 11/18/14   Joycie PeekBenjamin Cartner, PA-C  lidocaine  (XYLOCAINE) 2 % solution Use as directed 15 mLs in the mouth or throat as needed for mouth pain. Patient not taking: Reported on 04/02/2016 12/26/15   [facility] Pil[facility] Ward, PA-C  naproxen (NAPROSYN) 375 MG tablet Take 1 tablet (375 mg total) by mouth 2 (two) times daily. Patient not taking: Reported on 04/02/2016 01/27/16   April Palumbo, MD    Family History No family history on file.  Social History Social History  Substance Use Topics  . Smoking status: Never Smoker  . Smokeless tobacco: Never Used  . Alcohol use Yes     Comment: wine occasional     Allergies   Percocet [oxycodone-acetaminophen]   Review of Systems Review of Systems  Constitutional: Negative.   HENT: Negative.   Respiratory: Positive for cough and wheezing.   Cardiovascular: Positive for chest pain.  Gastrointestinal: Negative.   Musculoskeletal: Negative.   Skin: Negative.   Neurological: Negative.   Psychiatric/Behavioral: Negative.   All other systems reviewed and are negative.    Physical Exam Updated Vital Signs BP 158/56 (BP Location: Left Arm)   Pulse 81   Temp 98.1 F (36.7 C) (Oral)   Resp 19   Ht 5\' 2"  (1.575 m)   Wt 274 lb (124.3 kg)   SpO2 100%   BMI 50.12 kg/m   Physical Exam  Constitutional: She appears well-developed and well-nourished. No distress.  HENT:  Head: Normocephalic and atraumatic.  Eyes: Conjunctivae are normal.  Pupils are equal, round, and reactive to light.  Neck: Neck supple. No tracheal deviation present. No thyromegaly present.  Cardiovascular: Normal rate and regular rhythm.   No murmur heard. Pulmonary/Chest: Effort normal. She has wheezes.  No respiratory distress speaks in paragraphs Minimal end expiratory wheezes  Abdominal: Soft. Bowel sounds are normal. She exhibits no distension. There is no tenderness.  Obese  Musculoskeletal: Normal range of motion. She exhibits no edema or tenderness.  Neurological: She is alert. Coordination normal.  Skin: Skin is  warm and dry. No rash noted.  Psychiatric: She has a normal mood and affect.  Nursing note and vitals reviewed.    ED Treatments / Results  Labs (all labs ordered are listed, but only abnormal results are displayed) Labs Reviewed - No data to display  EKG  EKG Interpretation None       Radiology No results found.  Procedures Procedures (including critical care time)  Medications Ordered in ED Medications - No data to display   Initial Impression / Assessment and Plan / ED Course  I have reviewed the triage vital signs and the nursing notes.  Pertinent labs & imaging results that were available during my care of the patient were reviewed by me and considered in my medical decision making (see chart for details). Chest x-ray viewed by me Results for orders placed or performed during the hospital encounter of 03/30/16  Rapid strep screen  Result Value Ref Range   Streptococcus, Group A Screen (Direct) NEGATIVE NEGATIVE  Culture, group A strep  Result Value Ref Range   Specimen Description THROAT    Special Requests NONE Reflexed from [id number]    Culture      NO GROUP A STREP (S.PYOGENES) ISOLATED [facility]    Report Status 04/02/2016 FINAL    Dg Chest 2 View  Result Date: 04/03/2016 CLINICAL DATA:  Productive cough short of breath EXAM: CHEST  2 VIEW COMPARISON:  04/02/2015 FINDINGS: The heart size and mediastinal contours are within normal limits. Both lungs are clear. Degenerative changes of the thoracic spine with minimal contiguous wedging of thoracic vertebra. IMPRESSION: No active cardiopulmonary disease. Electronically Signed   By: Jasmine PangKim  Fujinaga M.D.   On: 04/03/2016 00:01   Clinical Course     12:20 AM patient breathing comfortably. No distress. Plan continue present medications. She's instructed to use albuterol nebulizer every 4 hours or albuterol inhaler 2 puffs every 4 hours. Return if needed more than every 4 hours. She'll be  referred to get primary care physician  Final Clinical Impressions(s) / ED Diagnoses   Final diagnoses:  None  Diagnosis acute bronchitis  New Prescriptions New Prescriptions   No medications on file     Doug SouSam Chayne Baumgart, MD 04/03/16 [id number]

## 2016-04-03 NOTE — Discharge Instructions (Signed)
Use your albuterol nebulizer every 4 hours or albuterol inhaler 2 puffs every 4 hours. Return if needed more than every 4 hours. [facility] or any of the numbers listed to get a primary care physician

## 2016-04-23 ENCOUNTER — Emergency Department (HOSPITAL_COMMUNITY): Payer: Self-pay

## 2016-04-23 ENCOUNTER — Emergency Department (HOSPITAL_COMMUNITY)
Admission: EM | Admit: 2016-04-23 | Discharge: 2016-04-23 | Disposition: A | Discharge status: Home / Self Care | Payer: Self-pay | Attending: Emergency Medicine | Admitting: Emergency Medicine

## 2016-04-23 ENCOUNTER — Encounter (HOSPITAL_COMMUNITY): Payer: Self-pay | Admitting: Emergency Medicine

## 2016-04-23 DIAGNOSIS — Z79899 Other long term (current) drug therapy: Secondary | ICD-10-CM | POA: Insufficient documentation

## 2016-04-23 DIAGNOSIS — J45909 Unspecified asthma, uncomplicated: Secondary | ICD-10-CM | POA: Insufficient documentation

## 2016-04-23 MED ORDER — PREDNISONE 20 MG PO TABS
60.0000 mg | ORAL_TABLET | Freq: Every day | ORAL | Status: DC
Start: 2016-04-23 — End: 2016-04-23
  Administered 2016-04-23: 60 mg via ORAL
  Filled 2016-04-23: qty 3

## 2016-04-23 MED ORDER — IPRATROPIUM-ALBUTEROL 0.5-2.5 (3) MG/3ML IN SOLN: 3.0000 mL | Freq: Once | RESPIRATORY_TRACT | Status: DC

## 2016-04-23 NOTE — ED Notes (Signed)
Patient declined to have nebulizer treatment. She stated she used her home Proventil inhaler prior to staff entering to do nebulizer and do not want to take both.

## 2016-04-23 NOTE — ED Provider Notes (Signed)
[facility]-EMER[facility]Y DEPT Provider Note   CSN: [id number] Arrival date & time: 04/23/16  [id number]     History   Chief Complaint Chief Complaint  Patient presents with  . Chest Pain    HPI Joan Fisher is a 55 y.o. female.  The history is provided by the patient. No language interpreter was used.  Chest Pain   This is a new problem. The problem occurs constantly. The problem has been gradually worsening. The pain is associated with breathing. The pain is mild. The pain does not radiate. Associated symptoms include shortness of breath. She has tried nothing for the symptoms. The treatment provided no relief. There are no known risk factors.  Pt reports she has a histroy of asthma.  Pt reports chest feels tight and she feels short of breath this am  Past Medical History:  Diagnosis Date  . Asthma   . Obesity     There are no active problems to display for this patient.   Past Surgical History:  Procedure Laterality Date  . dislocated shoulder repair     r  . SHOULDER SURGERY      OB History    No data available       Home Medications    Prior to Admission medications   Medication Sig Start Date End Date Taking? Authorizing Provider  albuterol (PROVENTIL HFA;VENTOLIN HFA) 108 (90 Base) MCG/ACT inhaler Inhale 1-2 puffs into the lungs every 6 (six) hours as needed for wheezing or shortness of breath. 03/30/16  Yes Iantha FallenKenneth T Leaphart, PA-C  benzonatate (TESSALON) 100 MG capsule Take 1 capsule (100 mg total) by mouth every 8 (eight) hours. Patient not taking: Reported on 04/02/2016 03/30/16   Rise MuKenneth T Leaphart, PA-C  ibuprofen (ADVIL,MOTRIN) 800 MG tablet Take 1 tablet (800 mg total) by mouth 3 (three) times daily. Patient not taking: Reported on 04/02/2015 11/18/14   Joycie PeekBenjamin Cartner, PA-C  lidocaine (XYLOCAINE) 2 % solution Use as directed 15 mLs in the mouth or throat as needed for mouth pain. Patient not taking: Reported on 04/02/2016 12/26/15   [facility] Pilcher Ward, PA-C    naproxen (NAPROSYN) 375 MG tablet Take 1 tablet (375 mg total) by mouth 2 (two) times daily. Patient not taking: Reported on 04/02/2016 01/27/16   April Palumbo, MD  predniSONE (DELTASONE) 20 MG tablet Take 2 tablets (40 mg total) by mouth daily with breakfast. Patient not taking: Reported on 04/23/2016 03/30/16   Rise MuKenneth T Leaphart, PA-C    Family History No family history on file.  Social History Social History  Substance Use Topics  . Smoking status: Never Smoker  . Smokeless tobacco: Never Used  . Alcohol use Yes     Comment: wine occasional     Allergies   Percocet [oxycodone-acetaminophen]   Review of Systems Review of Systems  Respiratory: Positive for shortness of breath.   Cardiovascular: Positive for chest pain.  All other systems reviewed and are negative.    Physical Exam Updated Vital Signs BP 159/85 (BP Location: Left Arm)   Pulse 93   Temp 98.1 F (36.7 C) (Oral)   Resp 18   SpO2 99%   Physical Exam  Constitutional: She is oriented to person, place, and time. She appears well-developed and well-nourished.  HENT:  Head: Normocephalic.  Eyes: EOM are normal.  Neck: Normal range of motion.  Pulmonary/Chest: Effort normal. She has wheezes.  Abdominal: She exhibits no distension.  Musculoskeletal: Normal range of motion.  Neurological: She is alert and oriented to  person, place, and time.  Psychiatric: She has a normal mood and affect.  Nursing note and vitals reviewed.    ED Treatments / Results  Labs (all labs ordered are listed, but only abnormal results are displayed) Labs Reviewed - No data to display  EKG  EKG Interpretation  Date/Time:  Wednesday April 23 2016 05:23:08 EST Ventricular Rate:  87 PR Interval:    QRS Duration: 85 QT Interval:  365 QTC Calculation: 440 R Axis:   43 Text Interpretation:  Sinus rhythm RSR' in V1 or V2, right VCD or RVH Baseline wander in lead(s) II III aVF No significant change since last tracing  Confirmed by Erroll Lunani, Adeleke Ayokunle [phone]([id number]) on 04/23/2016 5:38:23 AM Also confirmed by Erroll Lunani, Adeleke Ayokunle [phone]), editor [place]  CCT, BEVERLY ([id number])  on 04/23/2016 6:39:50 AM       Radiology Dg Chest 2 View  Result Date: 04/23/2016 CLINICAL DATA:  Acute onset of right upper anterior chest pain. Initial encounter. EXAM: CHEST  2 VIEW COMPARISON:  Chest radiograph performed 04/02/2016 FINDINGS: The lungs are well-aerated. Pulmonary vascularity is at the upper limits of normal. There is no evidence of focal opacification, pleural effusion or pneumothorax. The heart is normal in size; the mediastinal contour is within normal limits. No acute osseous abnormalities are seen. IMPRESSION: No acute cardiopulmonary process seen. Electronically Signed   By: Roanna RaiderJeffery  Chang M.D.   On: 04/23/2016 05:41    Procedures Procedures (including critical care time)  Medications Ordered in ED Medications  ipratropium-albuterol (DUONEB) 0.5-2.5 (3) MG/3ML nebulizer solution 3 mL (3 mLs Nebulization Not Given 04/23/16 0711)  predniSONE (DELTASONE) tablet 60 mg (60 mg Oral Given 04/23/16 0711)     Initial Impression / Assessment and Plan / ED Course  I have reviewed the triage vital signs and the nursing notes.  Pertinent labs & imaging results that were available during my care of the patient were reviewed by me and considered in my medical decision making (see chart for details).  Clinical Course       Final Clinical Impressions(s) / ED Diagnoses   Final diagnoses:  Moderate asthma, unspecified whether complicated, unspecified whether persistent    New Prescriptions Discharge Medication List as of 04/23/2016  8:07 AM    Pt refused neb treatment. She told nurse she used her inhaler and felt better  Pt left ama.  I was not made aware.     Lonia SkinnerLeslie K FincastleSofia, PA-C 04/23/16 [id number]    Tomasita CrumbleAdeleke Oni, MD 04/23/16 [phone]

## 2016-04-23 NOTE — ED Triage Notes (Addendum)
Pt c/o chest pain when she takes a deep breath; pt has had a cough for the past month and was seen here for evaluation and prednisone and cough syrup; states the cough hasn't gone away; pt is asthmatic; denies lightheadedness, sweating, N/V/D; states she sometimes experiences SOB

## 2016-04-23 NOTE — ED Notes (Signed)
ekg delayed pt transported to xray.

## 2016-09-04 IMAGING — CR DG CHEST 2V
2 series · 2 of 2 positions shown · non-contrast
Comparison: 11/04/2014

CLINICAL DATA: Chest pain and shortness of breath for 2 days.

EXAM:
CHEST  2 VIEW

[w chest pa]
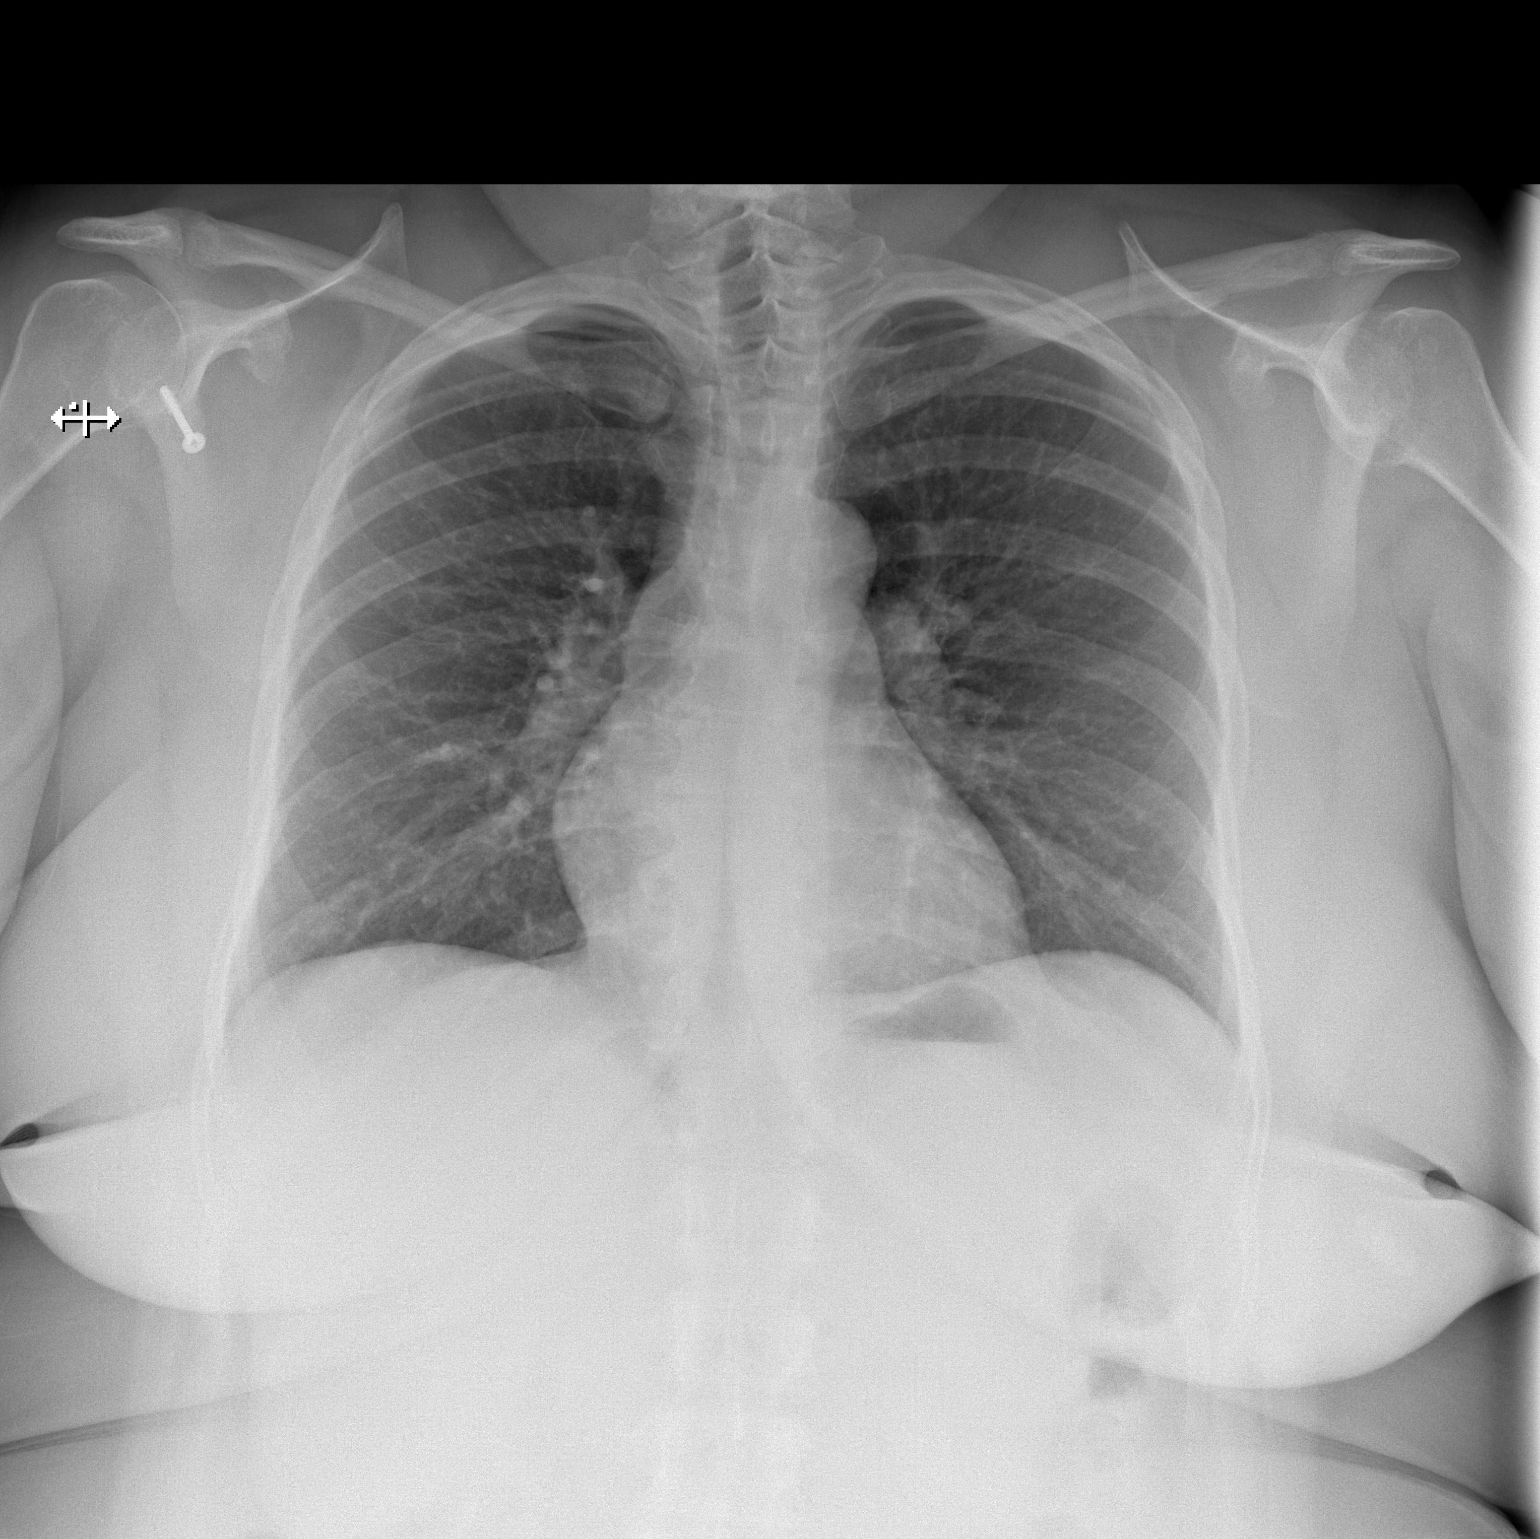

[w chest lat]
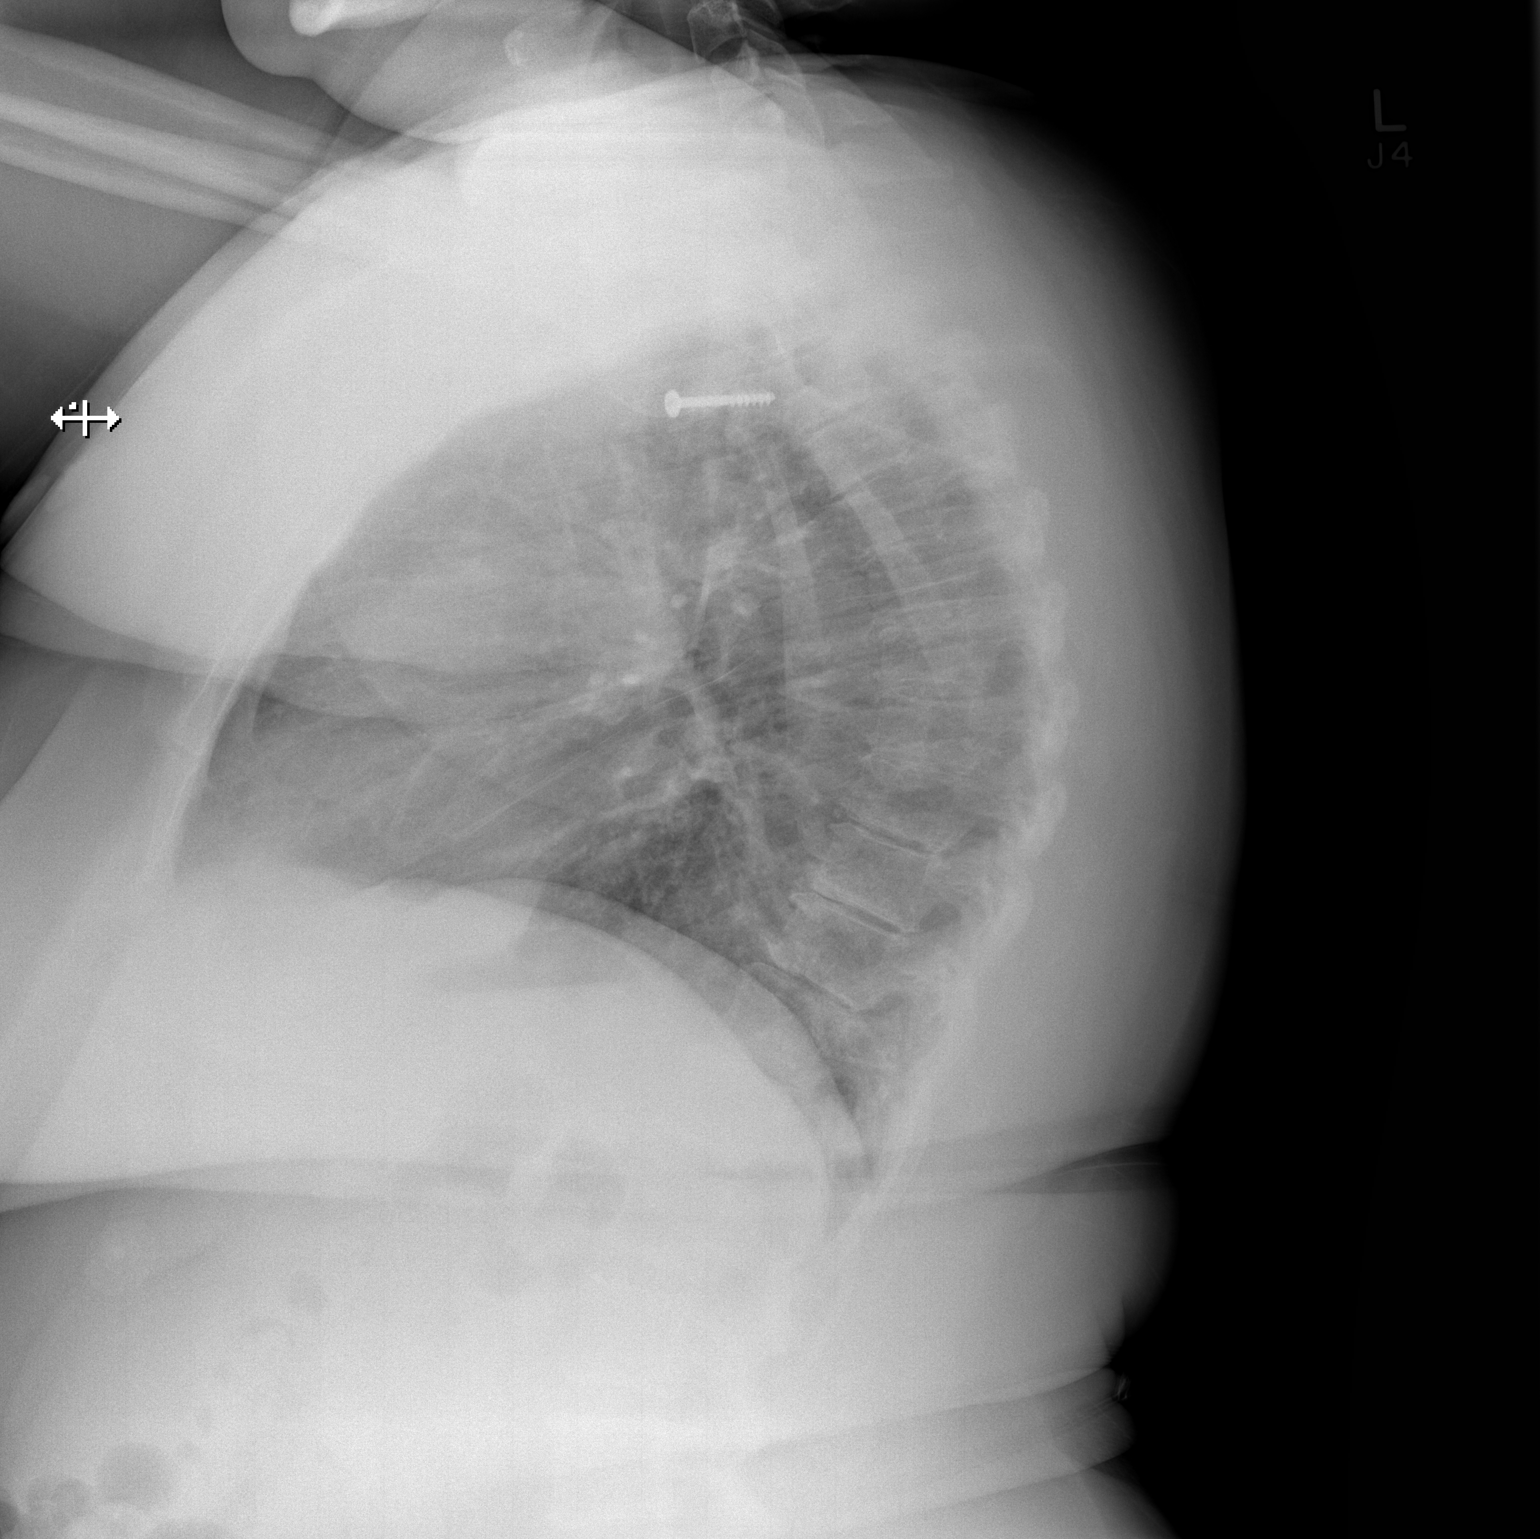

[2 of 2 positions shown; findings below may reference images not displayed]

FINDINGS: The heart size and mediastinal contours are within normal limits.
Both lungs are clear. The visualized skeletal structures are
unremarkable.
IMPRESSION: No active cardiopulmonary disease.

## 2017-03-04 ENCOUNTER — Encounter (HOSPITAL_COMMUNITY): Payer: Self-pay

## 2017-03-04 DIAGNOSIS — J0100 Acute maxillary sinusitis, unspecified: Secondary | ICD-10-CM | POA: Insufficient documentation

## 2017-03-04 DIAGNOSIS — J45909 Unspecified asthma, uncomplicated: Secondary | ICD-10-CM | POA: Insufficient documentation

## 2017-03-04 NOTE — ED Triage Notes (Signed)
Pt reports nasal congestion, facial pain, cough, and sore throat x 3 days. No fever/chills

## 2017-03-05 ENCOUNTER — Emergency Department (HOSPITAL_COMMUNITY)
Admission: EM | Admit: 2017-03-05 | Discharge: 2017-03-05 | Disposition: A | Discharge status: Home / Self Care | Payer: Self-pay | Attending: Emergency Medicine | Admitting: Emergency Medicine

## 2017-03-05 DIAGNOSIS — J0100 Acute maxillary sinusitis, unspecified: Secondary | ICD-10-CM

## 2017-03-05 LAB — RAPID STREP SCREEN (MED CTR MEBANE ONLY): Streptococcus, Group A Screen (Direct): NEGATIVE

## 2017-03-05 MED ORDER — IBUPROFEN 400 MG PO TABS
400.0000 mg | ORAL_TABLET | Freq: Once | ORAL | Status: AC | PRN
  Administered 2017-03-05: 400 mg via ORAL

## 2017-03-05 MED ORDER — AMOXICILLIN 500 MG PO CAPS: 500.0000 mg | ORAL_CAPSULE | Freq: Three times a day (TID) | ORAL | 0 refills | Status: DC

## 2017-03-05 MED ORDER — IBUPROFEN 400 MG PO TABS
ORAL_TABLET | ORAL | Status: AC
  Filled 2017-03-05: qty 1

## 2017-03-05 NOTE — ED Provider Notes (Signed)
[facility]-EMERGENCY DEPT Provider Note   CSN: [id number] Arrival date & time: 03/04/17  2252     History   Chief Complaint Chief Complaint  Patient presents with  . Nasal Congestion  . Sore Throat    HPI Joan Fisher is a 56 y.o. female.  Patient states 2 day history of facial burning, nasal congestion and sore throat. Facial burning is better than what it was yesterday. Sure throat has become worse today especially on the right side with pain with swallowing. No fever or chills. Daughter has been sick with a cough. Patient denies any productivity to her cough. Does have history of asthma. No chest pain or shortness of breath. No vomiting or diarrhea. No abdominal pain or chest pain.   The history is provided by the patient.  Sore Throat  Pertinent negatives include no chest pain, no abdominal pain, no headaches and no shortness of breath.    Past Medical History:  Diagnosis Date  . Asthma   . Obesity     There are no active problems to display for this patient.   Past Surgical History:  Procedure Laterality Date  . dislocated shoulder repair     r  . SHOULDER SURGERY      OB History    No data available       Home Medications    Prior to Admission medications   Medication Sig Start Date End Date Taking? Authorizing Provider  albuterol (PROVENTIL HFA;VENTOLIN HFA) 108 (90 Base) MCG/ACT inhaler Inhale 1-2 puffs into the lungs every 6 (six) hours as needed for wheezing or shortness of breath. 03/30/16   Rise Mu, PA-C  benzonatate (TESSALON) 100 MG capsule Take 1 capsule (100 mg total) by mouth every 8 (eight) hours. Patient not taking: Reported on 04/02/2016 03/30/16   Demetrios Loll T, PA-C  ibuprofen (ADVIL,MOTRIN) 800 MG tablet Take 1 tablet (800 mg total) by mouth 3 (three) times daily. Patient not taking: Reported on 04/02/2015 11/18/14   Joycie Peek, PA-C  lidocaine (XYLOCAINE) 2 % solution Use as directed 15 mLs in the mouth or throat as  needed for mouth pain. Patient not taking: Reported on 04/02/2016 12/26/15   Ward, Chase Picket, PA-C  naproxen (NAPROSYN) 375 MG tablet Take 1 tablet (375 mg total) by mouth 2 (two) times daily. Patient not taking: Reported on 04/02/2016 01/27/16   Palumbo, April, MD  predniSONE (DELTASONE) 20 MG tablet Take 2 tablets (40 mg total) by mouth daily with breakfast. Patient not taking: Reported on 04/23/2016 03/30/16   Rise Mu, PA-C    Family History No family history on file.  Social History Social History  Substance Use Topics  . Smoking status: Never Smoker  . Smokeless tobacco: Never Used  . Alcohol use Yes     Comment: wine occasional     Allergies   Percocet [oxycodone-acetaminophen]   Review of Systems Review of Systems  Constitutional: Negative for activity change, appetite change and fever.  HENT: Positive for congestion, rhinorrhea, sinus pain, sinus pressure and sore throat.   Respiratory: Positive for cough. Negative for shortness of breath.   Cardiovascular: Negative for chest pain.  Gastrointestinal: Negative for abdominal pain, nausea and vomiting.  Genitourinary: Negative for decreased urine volume and vaginal bleeding.  Musculoskeletal: Negative for myalgias.  Skin: Negative for rash.  Neurological: Negative for dizziness, weakness and headaches.    all other systems are negative except as noted in the HPI and PMH.    Physical Exam Updated Vital  Signs BP (!) 164/70 (BP Location: Right Arm)   Pulse 67   Temp 98.1 F (36.7 C) (Oral)   Resp 16   SpO2 100%   Physical Exam  Constitutional: She is oriented to person, place, and time. She appears well-developed and well-nourished. No distress.  HENT:  Head: Normocephalic and atraumatic.  Mouth/Throat: Oropharynx is clear and moist. No oropharyngeal exudate.  Frontal and maxillary sinus tenderness  Uvula midline. No asymmetry  Eyes: Pupils are equal, round, and reactive to light. Conjunctivae and  EOM are normal.  Neck: Normal range of motion. Neck supple.  No meningismus.  Cardiovascular: Normal rate, regular rhythm, normal heart sounds and intact distal pulses.   No murmur heard. Pulmonary/Chest: Effort normal and breath sounds normal. No respiratory distress. She has no wheezes.  Abdominal: Soft. There is no tenderness. There is no rebound and no guarding.  Musculoskeletal: Normal range of motion. She exhibits no edema or tenderness.  Neurological: She is alert and oriented to person, place, and time. No cranial nerve deficit. She exhibits normal muscle tone. Coordination normal.   5/5 strength throughout. CN 2-12 intact.Equal grip strength.   Skin: Skin is warm.  Psychiatric: She has a normal mood and affect. Her behavior is normal.  Nursing note and vitals reviewed.    ED Treatments / Results  Labs (all labs ordered are listed, but only abnormal results are displayed) Labs Reviewed  RAPID STREP SCREEN (NOT AT [facility])  CULTURE, GROUP A STREP [facility])    EKG  EKG Interpretation None       Radiology No results found.  Procedures Procedures (including critical care time)  Medications Ordered in ED Medications  ibuprofen (ADVIL,MOTRIN) 400 MG tablet (not administered)  ibuprofen (ADVIL,MOTRIN) tablet 400 mg (400 mg Oral Given 03/05/17 0158)     Initial Impression / Assessment and Plan / ED Course  I have reviewed the triage vital signs and the nursing notes.  Pertinent labs & imaging results that were available during my care of the patient were reviewed by me and considered in my medical decision making (see chart for details).    Patient with sore throat and facial tenderness, well-appearing, no distress, lungs are clear  Rapid strep is negative. Patient is nontoxic-appearing. No distress. Lungs are clear.  Will treat for sinusitis. Follow-up with PCP. Return precautions discussed.  Final Clinical Impressions(s) / ED Diagnoses   Final diagnoses:  Acute  maxillary sinusitis, recurrence not specified    New Prescriptions New Prescriptions   No medications on file     Glynn Octave, MD 03/05/17 [phone]

## 2017-03-05 NOTE — ED Notes (Signed)
ED Provider at bedside. 

## 2017-03-07 LAB — CULTURE, GROUP A STREP (THRC)

## 2017-09-05 IMAGING — CR DG CHEST 2V
2 series · 2 of 2 positions shown · non-contrast
Comparison: 04/02/2015

CLINICAL DATA: Productive cough short of breath

EXAM:
CHEST  2 VIEW

[w chest pa]
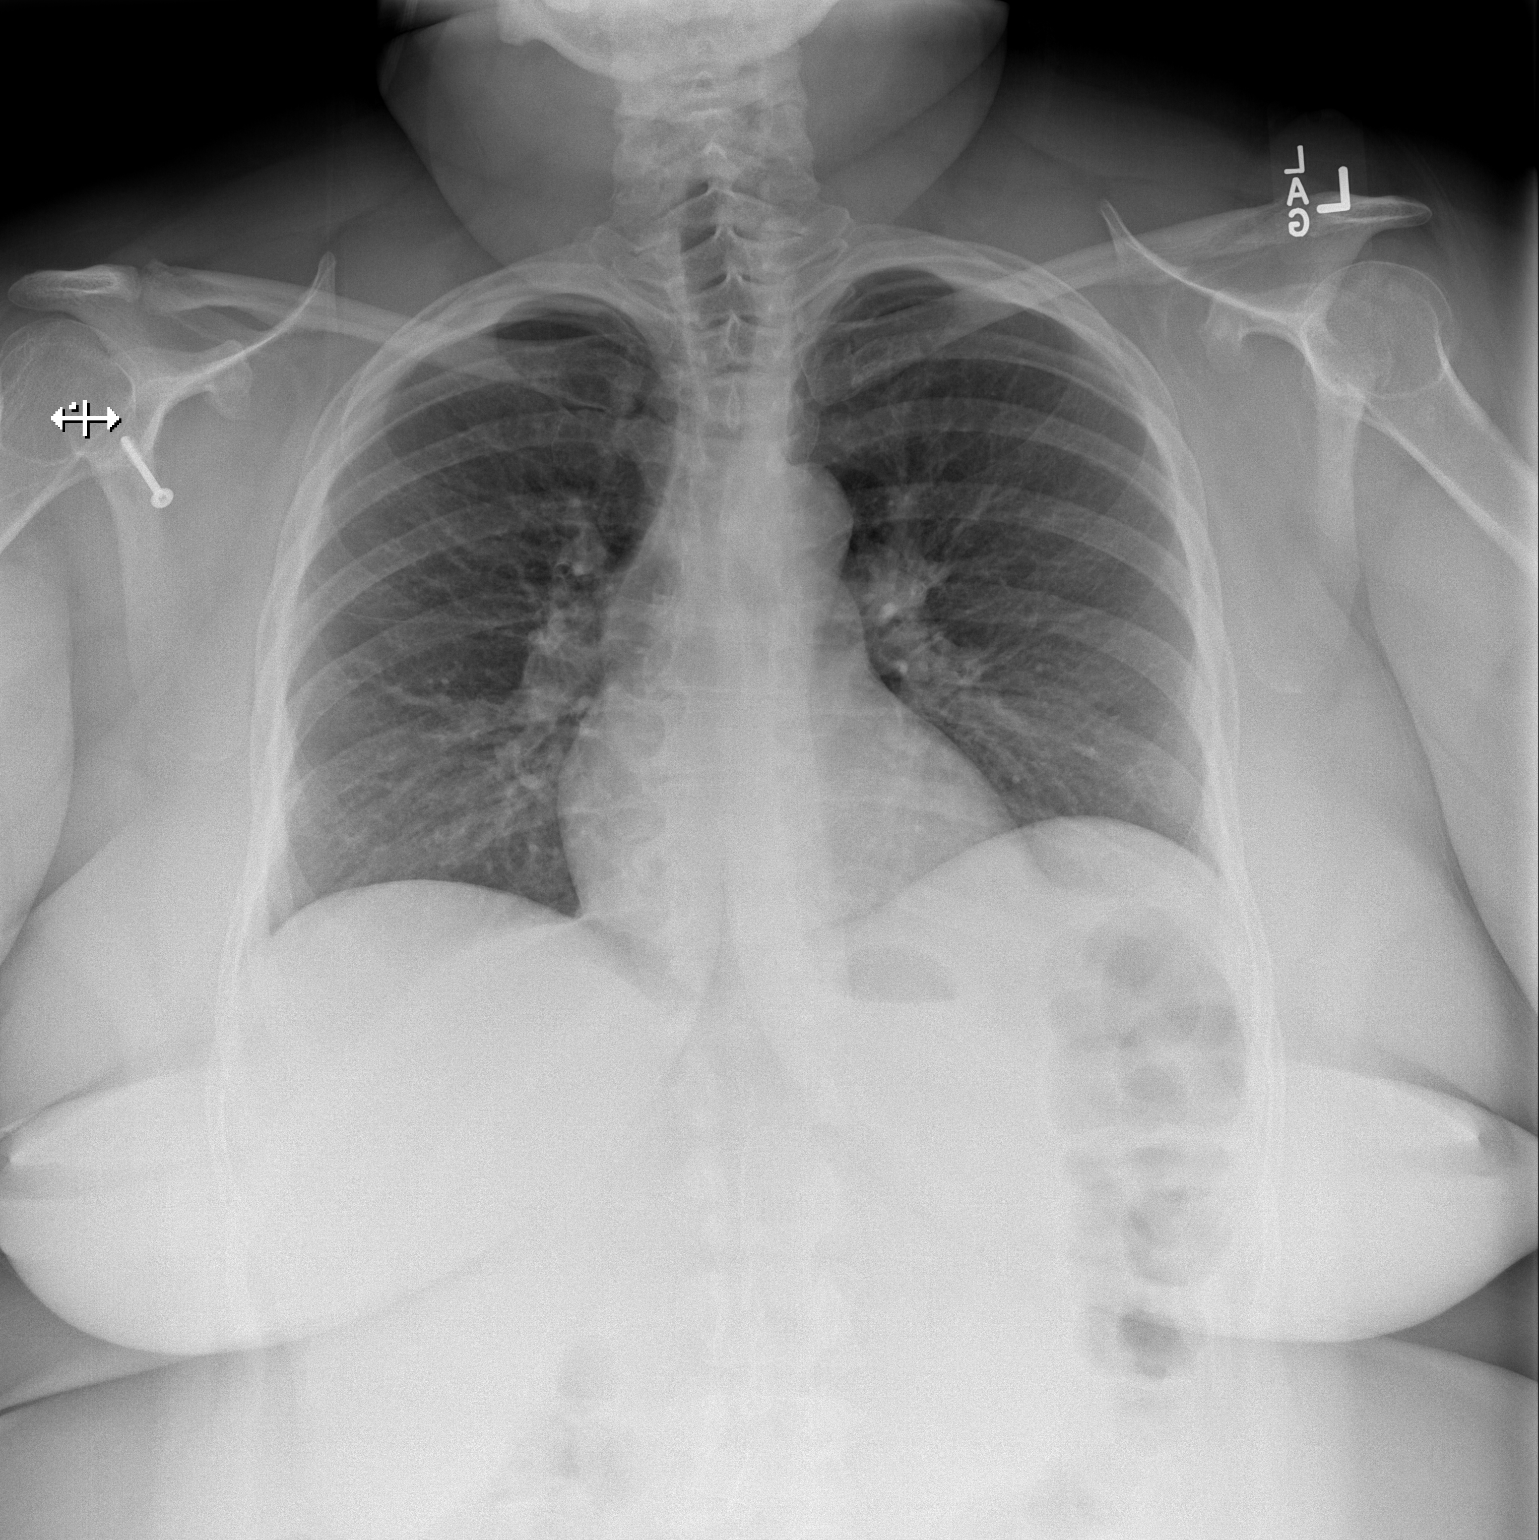

[w chest lat]
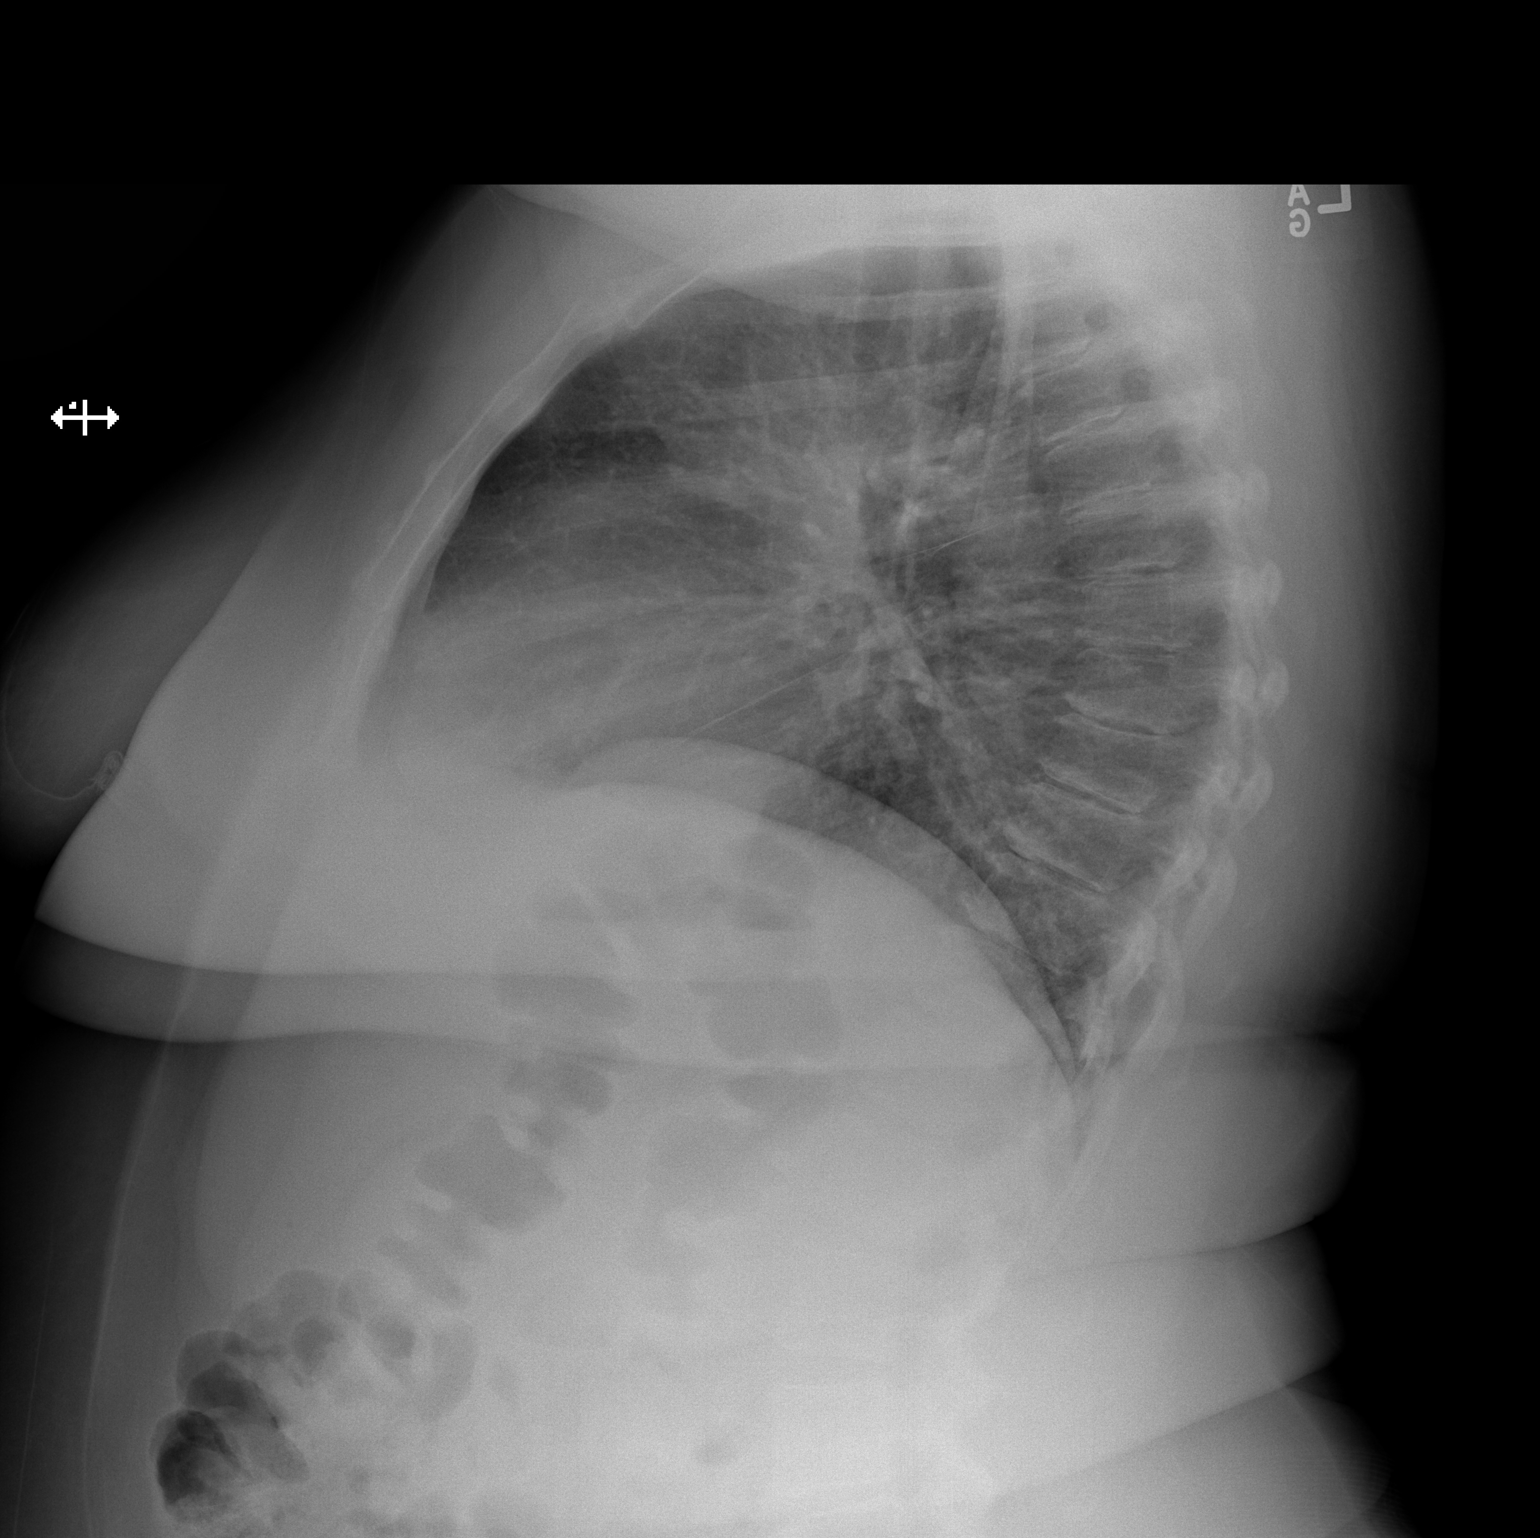

[2 of 2 positions shown; findings below may reference images not displayed]

FINDINGS: The heart size and mediastinal contours are within normal limits.
Both lungs are clear. Degenerative changes of the thoracic spine
with minimal contiguous wedging of thoracic vertebra.
IMPRESSION: No active cardiopulmonary disease.

## 2017-10-31 ENCOUNTER — Other Ambulatory Visit: Payer: Self-pay

## 2017-10-31 ENCOUNTER — Emergency Department (HOSPITAL_COMMUNITY)
Admission: EM | Admit: 2017-10-31 | Discharge: 2017-10-31 | Disposition: A | Discharge status: Home / Self Care | Payer: Self-pay | Attending: Emergency Medicine | Admitting: Emergency Medicine

## 2017-10-31 ENCOUNTER — Emergency Department (HOSPITAL_COMMUNITY): Payer: Self-pay

## 2017-10-31 ENCOUNTER — Encounter (HOSPITAL_COMMUNITY): Payer: Self-pay | Admitting: Emergency Medicine

## 2017-10-31 DIAGNOSIS — J209 Acute bronchitis, unspecified: Secondary | ICD-10-CM | POA: Insufficient documentation

## 2017-10-31 DIAGNOSIS — J45909 Unspecified asthma, uncomplicated: Secondary | ICD-10-CM | POA: Insufficient documentation

## 2017-10-31 MED ORDER — ACETAMINOPHEN 325 MG PO TABS
650.0000 mg | ORAL_TABLET | Freq: Once | ORAL | Status: AC
  Administered 2017-10-31: 650 mg via ORAL
  Filled 2017-10-31: qty 2

## 2017-10-31 NOTE — ED Triage Notes (Signed)
Patient states is complaining of a cough that she has had for about 8 days. Patient is also complaining of right flank pain. Patient states this started 2 days ago and did not injure herself.

## 2017-10-31 NOTE — ED Provider Notes (Signed)
[facility]-EMERGENCY DEPT Provider Note   CSN: [id number] Arrival date & time: 10/31/17  [id number]     History   Chief Complaint Chief Complaint  Patient presents with  . Cough  . Flank Pain    HPI Joan Fisher is a 57 y.o. female.  HPI 57 year old female with a history of asthma presents emergency department with cough over the past several days without significant shortness of breath.  She reports right-sided chest pain with coughing.  No urinary symptoms.  Denies nausea vomiting diarrhea.  Denies abdominal pain.  Reports some mild right flank pain.  No rash.  Symptoms are mild to moderate in severity.  She is tried her inhaler with some improvement in her cough.  Her son recently is returned home and is smoking outside of the house and she suspects sometimes in the house.  She feels like this may be contributing to her cough and upper airway irritation   Past Medical History:  Diagnosis Date  . Asthma   . Obesity     There are no active problems to display for this patient.   Past Surgical History:  Procedure Laterality Date  . dislocated shoulder repair     r  . SHOULDER SURGERY       OB History   None      Home Medications    Prior to Admission medications   Medication Sig Start Date End Date Taking? Authorizing Provider  albuterol (PROVENTIL HFA;VENTOLIN HFA) 108 (90 Base) MCG/ACT inhaler Inhale 1-2 puffs into the lungs every 6 (six) hours as needed for wheezing or shortness of breath. 03/30/16  Yes Wallace KellerLeaphart, Kenneth T, PA-C    Family History History reviewed. No pertinent family history.  Social History Social History   Tobacco Use  . Smoking status: Never Smoker  . Smokeless tobacco: Never Used  Substance Use Topics  . Alcohol use: Yes    Comment: wine occasional  . Drug use: No     Allergies   Percocet [oxycodone-acetaminophen]   Review of Systems Review of Systems  All other systems reviewed and are  negative.    Physical Exam Updated Vital Signs BP (!) 144/74 (BP Location: Left Arm)   Pulse 69   Temp 98.1 F (36.7 C) (Oral)   Resp 18   Ht 5\' 1"  (1.549 m)   Wt 122.5 kg (270 lb)   SpO2 99%   BMI 51.02 kg/m   Physical Exam  Constitutional: She is oriented to person, place, and time. She appears well-developed and well-nourished. No distress.  HENT:  Head: Normocephalic and atraumatic.  Eyes: EOM are normal.  Neck: Normal range of motion.  Cardiovascular: Normal rate and regular rhythm.  Pulmonary/Chest: Effort normal and breath sounds normal. No stridor. No respiratory distress. She has no wheezes. She has no rales.  Abdominal: Soft. She exhibits no distension. There is no tenderness.  Musculoskeletal: Normal range of motion.  Neurological: She is alert and oriented to person, place, and time.  Skin: Skin is warm and dry.  Psychiatric: She has a normal mood and affect. Judgment normal.  Nursing note and vitals reviewed.    ED Treatments / Results  Labs (all labs ordered are listed, but only abnormal results are displayed) Labs Reviewed - No data to display  EKG None  Radiology Dg Chest 2 View  Result Date: 10/31/2017 CLINICAL DATA:  Chest pain and cough EXAM: CHEST - 2 VIEW COMPARISON:  April 23, 2016 FINDINGS: Lungs are clear. The heart  size and pulmonary vascularity are normal. No adenopathy. There is degenerative change in the thoracic spine. IMPRESSION: No edema or consolidation. Electronically Signed   By: Bretta Bang III M.D.   On: 10/31/2017 08:13    Procedures Procedures (including critical care time)  Medications Ordered in ED Medications  acetaminophen (TYLENOL) tablet 650 mg (650 mg Oral Given 10/31/17 0847)     Initial Impression / Assessment and Plan / ED Course  I have reviewed the triage vital signs and the nursing notes.  Pertinent labs & imaging results that were available during my care of the patient were reviewed by me and  considered in my medical decision making (see chart for details).     Bronchitis and upper airway irritation.  No infiltrate on chest x-ray.  No wheezing on examination.  No increased work of breathing.  Recommended albuterol every 4 hours as needed cough.  No indication for steroids at this time.  Close primary care follow-up.  Patient understands return to the emergency department for new or worsening symptoms  Final Clinical Impressions(s) / ED Diagnoses   Final diagnoses:  Acute bronchitis, unspecified organism    ED Discharge Orders    None       Azalia Bilis, MD 10/31/17 [phone]

## 2020-03-15 ENCOUNTER — Encounter (HOSPITAL_COMMUNITY): Payer: Self-pay

## 2020-03-15 ENCOUNTER — Other Ambulatory Visit: Payer: Self-pay

## 2020-03-15 ENCOUNTER — Emergency Department (HOSPITAL_COMMUNITY)
Admission: EM | Admit: 2020-03-15 | Discharge: 2020-03-15 | Disposition: A | Discharge status: Home / Self Care | Payer: HRSA Program | Attending: Emergency Medicine | Admitting: Emergency Medicine

## 2020-03-15 DIAGNOSIS — R519 Headache, unspecified: Secondary | ICD-10-CM | POA: Diagnosis present

## 2020-03-15 DIAGNOSIS — J45909 Unspecified asthma, uncomplicated: Secondary | ICD-10-CM | POA: Insufficient documentation

## 2020-03-15 DIAGNOSIS — Z20822 Contact with and (suspected) exposure to covid-19: Secondary | ICD-10-CM

## 2020-03-15 DIAGNOSIS — U071 COVID-19: Secondary | ICD-10-CM | POA: Insufficient documentation

## 2020-03-15 LAB — RESPIRATORY PANEL BY RT PCR (FLU A&B, COVID)
Influenza A by PCR: NEGATIVE
Influenza B by PCR: NEGATIVE
SARS Coronavirus 2 by RT PCR: POSITIVE — AB

## 2020-03-15 NOTE — ED Triage Notes (Addendum)
Patient presents with a headache. She took 2 Tylenol at 1030 this morning. She said she also had throat congestion and asthma so she took 2 puffs of her inhaler and feels a little better. Her husband tested positive for Covid Monday.

## 2020-03-15 NOTE — ED Provider Notes (Addendum)
[facility]-EMERGENCY DEPT Provider Note   CSN: [id number] Arrival date & time: 03/15/20  1132     History Chief Complaint  Patient presents with  . Headache    Joan Fisher is a 59 y.o. female presenting for evaluation of headache, nasal congestion, shortness of breath.  Patient states her symptoms began yesterday.  She has been taking care of her husband and granddaughter, both were Covid positive as of this week.  Patient reports headache which improves with Tylenol, mild nasal congestion, and mild shortness of breath which was resolved with albuterol.  She has a history of asthma, no other medical problems.  She is not taking anything else for her symptoms.  She denies fevers, loss of taste or smell, chest pain, current shortness of breath, nausea, vomiting, abd pain.  She did not receive her Covid vaccines.  HPI     Past Medical History:  Diagnosis Date  . Asthma   . Obesity     There are no problems to display for this patient.   Past Surgical History:  Procedure Laterality Date  . dislocated shoulder repair     r  . SHOULDER SURGERY       OB History   No obstetric history on file.     No family history on file.  Social History   Tobacco Use  . Smoking status: Never Smoker  . Smokeless tobacco: Never Used  Vaping Use  . Vaping Use: Never used  Substance Use Topics  . Alcohol use: Yes    Comment: wine occasional  . Drug use: No    Home Medications Prior to Admission medications   Medication Sig Start Date End Date Taking? Authorizing Provider  albuterol (PROVENTIL HFA;VENTOLIN HFA) 108 (90 Base) MCG/ACT inhaler Inhale 1-2 puffs into the lungs every 6 (six) hours as needed for wheezing or shortness of breath. 03/30/16   Rise Mu, PA-C    Allergies    Percocet [oxycodone-acetaminophen]  Review of Systems   Review of Systems  HENT: Positive for congestion.   Respiratory: Positive for shortness of breath.     Neurological: Positive for headaches.    Physical Exam Updated Vital Signs BP (!) 175/90 (BP Location: Left Arm)   Pulse 79   Temp 99 F (37.2 C) (Oral)   Resp 16   Ht 5\' 2"  (1.575 m)   Wt 119.7 kg   SpO2 95%   BMI 48.29 kg/m   Physical Exam Vitals and nursing note reviewed.  Constitutional:      General: She is not in acute distress.    Appearance: She is well-developed.     Comments: Resting comfortably in the bed in no acute distress  HENT:     Head: Normocephalic and atraumatic.  Cardiovascular:     Rate and Rhythm: Normal rate and regular rhythm.     Pulses: Normal pulses.  Pulmonary:     Effort: Pulmonary effort is normal.     Comments: Speaking in full sentences.  Clear lung sounds in all fields.  No wheezing, rales, rhonchi.  Sats stable on room air. Abdominal:     General: There is no distension.     Palpations: There is no mass.     Tenderness: There is no abdominal tenderness. There is no guarding or rebound.  Musculoskeletal:        General: Normal range of motion.     Cervical back: Normal range of motion.  Skin:    General: Skin is  warm.     Capillary Refill: Capillary refill takes less than 2 seconds.     Findings: No rash.  Neurological:     Mental Status: She is alert and oriented to person, place, and time.     ED Results / Procedures / Treatments   Labs (all labs ordered are listed, but only abnormal results are displayed) Labs Reviewed  RESPIRATORY PANEL BY RT PCR (FLU A&B, COVID)    EKG None  Radiology No results found.  Procedures Procedures (including critical care time)  Medications Ordered in ED Medications - No data to display  ED Course  I have reviewed the triage vital signs and the nursing notes.  Pertinent labs & imaging results that were available during my care of the patient were reviewed by me and considered in my medical decision making (see chart for details).    MDM Rules/Calculators/A&P                           Patient presenting for evaluation of headache, nasal congestion, and shortness of breath in the setting of Covid exposure.  On exam, patient is nontoxic.  Pulmonary exam is overall reassuring.  She does not of any wheezing currently.  Likely Covid virus.  Also consider other viral illness causing flare of asthma exacerbation.  However patient without wheezing at this time, I do not believe she needs steroids at this time.  She was Covid tested, is aware results are pending.  If positive, encourage follow-up with the post Covid clinic and possible Mab treatment.  If negative, discussed high likelihood that she is still Covid positive and that she should quarantine at home.  At this time, patient appears safe for discharge.  Return precautions given.  Patient states she understands and agrees to plan.  Joan Fisher was evaluated in Emergency Department on 03/15/2020 for the symptoms described in the history of present illness. She was evaluated in the context of the global COVID-19 pandemic, which necessitated consideration that the patient might be at risk for infection with the SARS-CoV-2 virus that causes COVID-19. Institutional protocols and algorithms that pertain to the evaluation of patients at risk for COVID-19 are in a state of rapid change based on information released by regulatory bodies including the CDC and federal and state organizations. These policies and algorithms were followed during the patient's care in the ED.   Final Clinical Impression(s) / ED Diagnoses Final diagnoses:  Suspected COVID-19 virus infection  Exposure to COVID-19 virus    Rx / DC Orders ED Discharge Orders    None         Alveria Apley, PA-C 03/15/20 1343    Virgina Norfolk, DO 03/15/20 1354

## 2020-03-15 NOTE — Discharge Instructions (Addendum)
You likely have Covid.  This is a viral illness that should be treated symptomatically. Continue to use your albuterol inhaler as needed for shortness of breath and wheezing. Use Tylenol or ibuprofen as needed for headache or fever. Make sure stay well-hydrated water. If your Covid test is positive, you should receive a phone call.  If negative, you will not.  Either way, you may check online on MyChart. If test is positive, follow-up with the post Covid clinic listed below.  They may discuss with you the option of monoclonal antibody treatment to lessen the severity of your symptoms. If Covid test is negative, you should still assume your Covid positive due to your exposure.  You will need to quarantine at home for a total of 10 days from symptom onset.  If you are fever free without medicine for 24 hours and your symptoms are improving after 10 days, you may injure quarantine. Return to the emergency room if you develop severe worsening difficulty breathing, or any new, worsening, or concerning symptoms.

## 2020-03-16 ENCOUNTER — Telehealth: Payer: Self-pay | Admitting: Infectious Diseases

## 2020-03-16 NOTE — Telephone Encounter (Signed)
Called to Discuss with patient about Covid symptoms and the use of the monoclonal antibody infusion for those with mild to moderate Covid symptoms and at a high risk of hospitalization.     Pt appears to qualify for this infusion due to co-morbid conditions and/or a member of an at-risk group in accordance with the FDA Emergency Use Authorization.    Sx started 10/20.  Risk factors include BMI > 48, at risk ethnic group, she is not vaccinated. She would like to consider and let me know.    Rexene Alberts, MSN, NP-C [facility]  [email or web address] Pager: [phone] Office: [phone] [facility] Main Line: [phone]

## 2020-03-18 ENCOUNTER — Encounter (HOSPITAL_COMMUNITY): Payer: Self-pay | Admitting: Emergency Medicine

## 2020-03-18 ENCOUNTER — Other Ambulatory Visit: Payer: Self-pay

## 2020-03-18 ENCOUNTER — Emergency Department (HOSPITAL_COMMUNITY): Payer: 59

## 2020-03-18 ENCOUNTER — Emergency Department (HOSPITAL_COMMUNITY)
Admission: EM | Admit: 2020-03-18 | Discharge: 2020-03-18 | Disposition: A | Discharge status: Home / Self Care | Payer: 59 | Attending: Emergency Medicine | Admitting: Emergency Medicine

## 2020-03-18 DIAGNOSIS — R000 Tachycardia, unspecified: Secondary | ICD-10-CM | POA: Diagnosis not present

## 2020-03-18 DIAGNOSIS — R0789 Other chest pain: Secondary | ICD-10-CM | POA: Diagnosis present

## 2020-03-18 DIAGNOSIS — J45909 Unspecified asthma, uncomplicated: Secondary | ICD-10-CM | POA: Diagnosis not present

## 2020-03-18 DIAGNOSIS — U071 COVID-19: Secondary | ICD-10-CM | POA: Diagnosis not present

## 2020-03-18 DIAGNOSIS — R079 Chest pain, unspecified: Secondary | ICD-10-CM

## 2020-03-18 LAB — CBC WITH DIFFERENTIAL/PLATELET
Abs Immature Granulocytes: 0.01 10*3/uL (ref 0.00–0.07)
Basophils Absolute: 0 10*3/uL (ref 0.0–0.1)
Basophils Relative: 0 %
Eosinophils Absolute: 0 10*3/uL (ref 0.0–0.5)
Eosinophils Relative: 0 %
HCT: 39.6 % (ref 36.0–46.0)
Hemoglobin: 12.7 g/dL (ref 12.0–15.0)
Immature Granulocytes: 0 %
Lymphocytes Relative: 16 %
Lymphs Abs: 0.9 10*3/uL (ref 0.7–4.0)
MCH: 29.5 pg (ref 26.0–34.0)
MCHC: 32.1 g/dL (ref 30.0–36.0)
MCV: 92.1 fL (ref 80.0–100.0)
Monocytes Absolute: 0.5 10*3/uL (ref 0.1–1.0)
Monocytes Relative: 8 %
Neutro Abs: 4.5 10*3/uL (ref 1.7–7.7)
Neutrophils Relative %: 76 %
Platelets: 180 10*3/uL (ref 150–400)
RBC: 4.3 MIL/uL (ref 3.87–5.11)
RDW: 13.4 % (ref 11.5–15.5)
WBC: 5.9 10*3/uL (ref 4.0–10.5)
nRBC: 0 % (ref 0.0–0.2)

## 2020-03-18 LAB — COMPREHENSIVE METABOLIC PANEL
ALT: 34 U/L (ref 0–44)
AST: 39 U/L (ref 15–41)
Albumin: 3.8 g/dL (ref 3.5–5.0)
Alkaline Phosphatase: 50 U/L (ref 38–126)
Anion gap: 10 (ref 5–15)
BUN: 9 mg/dL (ref 6–20)
CO2: 25 mmol/L (ref 22–32)
Calcium: 8.7 mg/dL — ABNORMAL LOW (ref 8.9–10.3)
Chloride: 104 mmol/L (ref 98–111)
Creatinine, Ser: 0.97 mg/dL (ref 0.44–1.00)
GFR, Estimated: >60 mL/min (ref >60)
Glucose, Bld: 172 mg/dL — ABNORMAL HIGH (ref 70–99)
Potassium: 3.7 mmol/L (ref 3.5–5.1)
Sodium: 139 mmol/L (ref 135–145)
Total Bilirubin: 0.8 mg/dL (ref 0.3–1.2)
Total Protein: 7.8 g/dL (ref 6.5–8.1)

## 2020-03-18 LAB — TROPONIN I (HIGH SENSITIVITY): Troponin I (High Sensitivity): 4 ng/L (ref <18)

## 2020-03-18 LAB — LIPASE, BLOOD: Lipase: 34 U/L (ref 11–51)

## 2020-03-18 MED ORDER — ALBUTEROL SULFATE HFA 108 (90 BASE) MCG/ACT IN AERS
8.0000 | INHALATION_SPRAY | Freq: Once | RESPIRATORY_TRACT | Status: AC
  Administered 2020-03-18: 8 via RESPIRATORY_TRACT
  Filled 2020-03-18: qty 6.7

## 2020-03-18 MED ORDER — IOHEXOL 350 MG/ML SOLN: 100.0000 mL | Freq: Once | INTRAVENOUS | Status: DC | PRN

## 2020-03-18 MED ORDER — AEROCHAMBER PLUS FLO-VU MEDIUM MISC
1.0000 | Freq: Once | Status: AC
  Administered 2020-03-18: 1
  Filled 2020-03-18: qty 1

## 2020-03-18 MED ORDER — SODIUM CHLORIDE (PF) 0.9 % IJ SOLN
INTRAMUSCULAR | Status: AC
  Filled 2020-03-18: qty 50

## 2020-03-18 NOTE — ED Notes (Signed)
Patient leaving AMA

## 2020-03-18 NOTE — ED Provider Notes (Signed)
[facility]-EMERGENCY DEPT Provider Note   CSN: [id number] Arrival date & time: 03/18/20  2376     History Chief Complaint  Patient presents with   Covid Positive    Joan Fisher is a 59 y.o. female with a hx of asthma, obesity presents to the Emergency Department complaining of gradual, persistent, progressively worsening right-sided flank and chest pain onset approximately 2 hours ago upon waking.  Patient reports she has had an increase shortness of breath over the last several days.  Pain in her chest is worse with exertion and with deep breathing.  It is not reproducible with palpation.  She reports associated myalgias, fevers, chills, headache, generalized fatigue.  She was diagnosed with COVID-19 on 03/11/2020.  She is unvaccinated.  Nothing seems to make her symptoms better.  Patient reports her primary concern is the chest pain.  The history is provided by the patient and medical records. No language interpreter was used.    HPI: A 59 year old patient with a history of obesity presents for evaluation of chest pain. Initial onset of pain was approximately 1-3 hours ago. The patient's chest pain is sharp and is worse with exertion. The patient's chest pain is not middle- or left-sided, is not well-localized, is not described as heaviness/pressure/tightness and does not radiate to the arms/jaw/neck. The patient does not complain of nausea and denies diaphoresis. The patient has no history of stroke, has no history of peripheral artery disease, has not smoked in the past 90 days, denies any history of treated diabetes, has no relevant family history of coronary artery disease (first degree relative at less than age 42), is not hypertensive and has no history of hypercholesterolemia.   Past Medical History:  Diagnosis Date   Asthma    Obesity     There are no problems to display for this patient.   Past Surgical History:  Procedure Laterality Date    dislocated shoulder repair     r   SHOULDER SURGERY       OB History   No obstetric history on file.     History reviewed. No pertinent family history.  Social History   Tobacco Use   Smoking status: Never Smoker   Smokeless tobacco: Never Used  Vaping Use   Vaping Use: Never used  Substance Use Topics   Alcohol use: Yes    Comment: wine occasional   Drug use: No    Home Medications Prior to Admission medications   Medication Sig Start Date End Date Taking? Authorizing Provider  albuterol (PROVENTIL HFA;VENTOLIN HFA) 108 (90 Base) MCG/ACT inhaler Inhale 1-2 puffs into the lungs every 6 (six) hours as needed for wheezing or shortness of breath. 03/30/16   Rise Mu, PA-C    Allergies    Percocet [oxycodone-acetaminophen]  Review of Systems   Review of Systems  Constitutional: Positive for chills and fever. Negative for appetite change, diaphoresis, fatigue and unexpected weight change.  HENT: Negative for mouth sores.   Eyes: Negative for visual disturbance.  Respiratory: Positive for shortness of breath. Negative for cough, chest tightness and wheezing.   Cardiovascular: Positive for chest pain.  Gastrointestinal: Negative for abdominal pain, constipation, diarrhea, nausea and vomiting.  Endocrine: Negative for polydipsia, polyphagia and polyuria.  Genitourinary: Negative for dysuria, frequency, hematuria and urgency.  Musculoskeletal: Positive for myalgias. Negative for back pain and neck stiffness.  Skin: Negative for rash.  Allergic/Immunologic: Negative for immunocompromised state.  Neurological: Positive for headaches. Negative for syncope and light-headedness.  Hematological: Does not bruise/bleed easily.  Psychiatric/Behavioral: Negative for sleep disturbance. The patient is not nervous/anxious.     Physical Exam Updated Vital Signs BP 127/89 (BP Location: Left Arm)    Pulse 89    Temp 99.7 F (37.6 C) (Oral)    Resp 16    Ht 5\' 2"  (1.575  m)    Wt 119.7 kg    SpO2 96%    BMI 48.29 kg/m   Physical Exam Vitals and nursing note reviewed.  Constitutional:      General: She is not in acute distress.    Appearance: She is not diaphoretic.  HENT:     Head: Normocephalic.  Eyes:     General: No scleral icterus.    Conjunctiva/sclera: Conjunctivae normal.  Cardiovascular:     Rate and Rhythm: Regular rhythm. Tachycardia present.     Pulses: Normal pulses.          Radial pulses are 2+ on the right side and 2+ on the left side.     Heart sounds: No murmur heard.      Comments: Mild tachycardia Pulmonary:     Effort: No tachypnea, accessory muscle usage, prolonged expiration, respiratory distress or retractions.     Breath sounds: No stridor.     Comments: Equal chest rise. Mild increased work of breathing. Coarse breath sounds throughout Abdominal:     General: There is no distension.     Palpations: Abdomen is soft.     Tenderness: There is no abdominal tenderness. There is no guarding or rebound.  Musculoskeletal:     Cervical back: Normal range of motion.     Comments: Moves all extremities equally and without difficulty.  Skin:    General: Skin is warm and dry.     Capillary Refill: Capillary refill takes less than 2 seconds.  Neurological:     Mental Status: She is alert.     GCS: GCS eye subscore is 4. GCS verbal subscore is 5. GCS motor subscore is 6.     Comments: Speech is clear and goal oriented.  Psychiatric:        Mood and Affect: Mood normal.     ED Results / Procedures / Treatments   Labs (all labs ordered are listed, but only abnormal results are displayed) Labs Reviewed  CBC WITH DIFFERENTIAL/PLATELET  COMPREHENSIVE METABOLIC PANEL  LIPASE, BLOOD  TROPONIN I (HIGH SENSITIVITY)    EKG EKG Interpretation  Date/Time:  Sunday March 18 2020 05:56:49 EDT Ventricular Rate:  89 PR Interval:    QRS Duration: 78 QT Interval:  338 QTC Calculation: 412 R Axis:   10 Text  Interpretation: Sinus rhythm Abnormal R-wave progression, early transition LVH with secondary repolarization abnormality 12 Lead; Mason-Likar Confirmed by 05-30-2002 (Ross Marcus) on 03/18/2020 6:35:42 AM   Procedures Procedures (including critical care time)  Medications Ordered in ED Medications  albuterol (VENTOLIN HFA) 108 (90 Base) MCG/ACT inhaler 8 puff (8 puffs Inhalation Given 03/18/20 0625)  AeroChamber Plus Flo-Vu Medium MISC 1 each (1 each Other Given 03/18/20 03/20/20)    ED Course  I have reviewed the triage vital signs and the nursing notes.  Pertinent labs & imaging results that were available during my care of the patient were reviewed by me and considered in my medical decision making (see chart for details).    MDM Rules/Calculators/A&P HEAR Score: 2  Sharine Cadle was evaluated in Emergency Department on 03/18/2020 for the symptoms described in the history of present illness. She was evaluated in the context of the global COVID-19 pandemic, which necessitated consideration that the patient might be at risk for infection with the SARS-CoV-2 virus that causes COVID-19. Institutional protocols and algorithms that pertain to the evaluation of patients at risk for COVID-19 are in a state of rapid change based on information released by regulatory bodies including the CDC and federal and state organizations. These policies and algorithms were followed during the patient's care in the ED.   Patient presents to the emergency department with known diagnosis of COVID-19, shortness of breath and chest pain.  Given nonreproducible chest pain on exam and pleuritic nature will obtain blood work and a CTA to evaluate for pulmonary embolism.  Given patient's history of asthma and coarse breath sounds, will give albuterol.  6:34 AM At shift change care was transferred to [facility] who will follow pending studies, re-evaulate and determine disposition.     Final  Clinical Impression(s) / ED Diagnoses Final diagnoses:  COVID-19  Chest pain, unspecified type    Rx / DC Orders ED Discharge Orders    None       Joan Fisher, Boyd Kerbs 03/18/20 0636    Horton, Mayer Masker, MD 03/18/20 [phone]

## 2020-03-18 NOTE — ED Triage Notes (Signed)
Pt reports that she was dx'd with COVID on 03/15/21. She is complaining of R chest pain that radiates down to the bottom of her R ribs that started this morning. She states that the pain is worse with exertion. Also reports dyspnea on exertion. A&Ox4. Ambulatory.

## 2020-03-18 NOTE — ED Provider Notes (Signed)
  Physical Exam  BP (!) 145/83   Pulse 76   Temp 99.7 F (37.6 C) (Oral)   Resp 17   Ht 5\' 2"  (1.575 m)   Wt 119.7 kg   SpO2 94%   BMI 48.29 kg/m   Physical Exam  Gen: appears nontoxic CV:RRR Pulm: CTAB. No hypoxia. Speaking in full sentences.   ED Course/Procedures     Procedures  MDM  Pt signed out to me by H Muthersbaugh, PA-C. Please see previous notes for further history.   In brief, pt presenting for evaluation of worsening R sided flank and CP in the setting of covid infection. Pt developed covid sxs on the 17th, tested positive a few days ago. She is pending labs and CTA tyo r/o PE due to new pain. Pt ambulated with mild tachycardia, but no hypoxia or respiratory distress. If labs/imaging is negative, plan for d/c.   Labs interpreted by me, overall reassuring.  CT pending.  When CT went to get patient, she reports a shortness of breath contrast allergy.  This was not documented in her chart, and chart has been updated.  Patient declines CT at this time.  I reassessed patient and discussed options with her.  Patient states she burped and her chest pain resolved.  She reports no shortness of breath, lightheadedness, or dizziness.  She states she is feeling well, would like to be discharged.  I discussed other options such as a VQ scan, patient declined.  I discussed return to the ER with worsening shortness of breath or chest pain.  Patient knows that she is eligible for monoclonal antibody, and will reach out to the clinic if she would like treatment.  At this time, patient appears safe for discharge.  Return precautions given.  Patient states she understands and agrees to plan.     18, PA-C 03/18/20 03/20/20    [id number], MD 03/18/20 [phone]

## 2020-03-19 ENCOUNTER — Emergency Department (HOSPITAL_COMMUNITY)
Admission: EM | Admit: 2020-03-19 | Discharge: 2020-03-19 | Disposition: A | Discharge status: Home / Self Care | Payer: 59 | Attending: Emergency Medicine | Admitting: Emergency Medicine

## 2020-03-19 ENCOUNTER — Other Ambulatory Visit: Payer: Self-pay

## 2020-03-19 ENCOUNTER — Emergency Department (HOSPITAL_COMMUNITY): Payer: 59

## 2020-03-19 ENCOUNTER — Encounter (HOSPITAL_COMMUNITY): Payer: Self-pay | Admitting: Student

## 2020-03-19 DIAGNOSIS — J45909 Unspecified asthma, uncomplicated: Secondary | ICD-10-CM | POA: Diagnosis not present

## 2020-03-19 DIAGNOSIS — U071 COVID-19: Secondary | ICD-10-CM | POA: Insufficient documentation

## 2020-03-19 DIAGNOSIS — R0602 Shortness of breath: Secondary | ICD-10-CM | POA: Diagnosis present

## 2020-03-19 LAB — CBC WITH DIFFERENTIAL/PLATELET
Abs Immature Granulocytes: 0.01 10*3/uL (ref 0.00–0.07)
Basophils Absolute: 0 10*3/uL (ref 0.0–0.1)
Basophils Relative: 0 %
Eosinophils Absolute: 0 10*3/uL (ref 0.0–0.5)
Eosinophils Relative: 0 %
HCT: 40.1 % (ref 36.0–46.0)
Hemoglobin: 12.9 g/dL (ref 12.0–15.0)
Immature Granulocytes: 0 %
Lymphocytes Relative: 23 %
Lymphs Abs: 1 10*3/uL (ref 0.7–4.0)
MCH: 29.1 pg (ref 26.0–34.0)
MCHC: 32.2 g/dL (ref 30.0–36.0)
MCV: 90.3 fL (ref 80.0–100.0)
Monocytes Absolute: 0.4 10*3/uL (ref 0.1–1.0)
Monocytes Relative: 8 %
Neutro Abs: 3 10*3/uL (ref 1.7–7.7)
Neutrophils Relative %: 69 %
Platelets: 178 10*3/uL (ref 150–400)
RBC: 4.44 MIL/uL (ref 3.87–5.11)
RDW: 13.4 % (ref 11.5–15.5)
WBC: 4.4 10*3/uL (ref 4.0–10.5)
nRBC: 0 % (ref 0.0–0.2)

## 2020-03-19 LAB — BASIC METABOLIC PANEL
Anion gap: 12 (ref 5–15)
BUN: 9 mg/dL (ref 6–20)
CO2: 24 mmol/L (ref 22–32)
Calcium: 8.8 mg/dL — ABNORMAL LOW (ref 8.9–10.3)
Chloride: 103 mmol/L (ref 98–111)
Creatinine, Ser: 0.95 mg/dL (ref 0.44–1.00)
GFR, Estimated: >60 mL/min (ref >60)
Glucose, Bld: 115 mg/dL — ABNORMAL HIGH (ref 70–99)
Potassium: 3.6 mmol/L (ref 3.5–5.1)
Sodium: 139 mmol/L (ref 135–145)

## 2020-03-19 MED ORDER — EPINEPHRINE 0.3 MG/0.3ML IJ SOAJ: 0.3000 mg | Freq: Once | INTRAMUSCULAR | Status: DC | PRN

## 2020-03-19 MED ORDER — DIPHENHYDRAMINE HCL 50 MG/ML IJ SOLN: 50.0000 mg | Freq: Once | INTRAMUSCULAR | Status: DC | PRN

## 2020-03-19 MED ORDER — METHYLPREDNISOLONE SODIUM SUCC 125 MG IJ SOLR
125.0000 mg | Freq: Once | INTRAMUSCULAR | Status: AC
  Administered 2020-03-19: 125 mg via INTRAVENOUS
  Filled 2020-03-19: qty 2

## 2020-03-19 MED ORDER — PREDNISONE 20 MG PO TABS: 60.0000 mg | ORAL_TABLET | Freq: Every day | ORAL | 0 refills | Status: DC

## 2020-03-19 MED ORDER — FAMOTIDINE IN NACL 20-0.9 MG/50ML-% IV SOLN: 20.0000 mg | Freq: Once | INTRAVENOUS | Status: DC | PRN

## 2020-03-19 MED ORDER — SODIUM CHLORIDE 0.9 % IV SOLN: 1200.0000 mg | Freq: Once | INTRAVENOUS | Status: DC

## 2020-03-19 MED ORDER — SODIUM CHLORIDE 0.9 % IV SOLN
Freq: Once | INTRAVENOUS | Status: AC
  Filled 2020-03-19: qty 700

## 2020-03-19 MED ORDER — SODIUM CHLORIDE 0.9 % IV BOLUS
500.0000 mL | Freq: Once | INTRAVENOUS | Status: AC
  Administered 2020-03-19: 500 mL via INTRAVENOUS

## 2020-03-19 MED ORDER — SODIUM CHLORIDE 0.9 % IV SOLN: INTRAVENOUS | Status: DC | PRN

## 2020-03-19 MED ORDER — ALBUTEROL SULFATE HFA 108 (90 BASE) MCG/ACT IN AERS: 2.0000 | INHALATION_SPRAY | Freq: Once | RESPIRATORY_TRACT | Status: DC | PRN

## 2020-03-19 NOTE — Discharge Instructions (Addendum)
You were seen in the emergency department for worsening shortness of breath and diarrhea in the setting of a recent Covid diagnosis. Your lab work did not show any significant abnormalities and your oxygen level was stable. You received monoclonal antibodies. Please contact the post Covid care center for follow-up. Return to the emergency department if any worsening shortness of breath or other concerns.

## 2020-03-19 NOTE — ED Triage Notes (Addendum)
Patient BIB POV.  Patient was dx'd with COVID on 03/13/2020.  Patient feels more SOB than she has been feeling.  Used her albuterol inhaler and did not feel that it helped her much.  Patient has been having multiple loose stools and has not been drinking/eating much due to no appetite. Everyone at home also has Covid.  Patient is not vaccinated at this time. Patient had a fever at home of 102 and took tylenol about 2 hours ago.

## 2020-03-19 NOTE — ED Provider Notes (Signed)
[facility]-EMERGENCY DEPT Provider Note   CSN: [id number] Arrival date & time: 03/19/20  1649     History Chief Complaint  Patient presents with  . Cough  . Shortness of Breath  . Fever  . Weakness    Joan Fisher is a 59 y.o. female.  Joan Fisher has a history of asthma.  Joan Fisher was diagnosed with Covid on the 19th of this month.  Joan Fisher was seen 2 days ago for increased shortness of breath but was oxygenating well and was not felt that Joan Fisher needed to be admitted.  Joan Fisher said [facility] called her but Joan Fisher has not set up an appointment to see them.  Complaining of cough worsening shortness of breath fever to 102 diarrhea generalized fatigue poor p.o. intake.  The history is provided by the patient.  Shortness of Breath Severity:  Moderate Onset quality:  Gradual Timing:  Constant Progression:  Worsening Context: activity   Relieved by:  Nothing Worsened by:  Activity Ineffective treatments:  Inhaler Associated symptoms: cough and fever   Associated symptoms: no abdominal pain, no chest pain, no hemoptysis, no rash and no sore throat   Fever Associated symptoms: cough, diarrhea and myalgias   Associated symptoms: no chest pain, no dysuria, no nausea, no rash and no sore throat   Weakness Severity:  Moderate Onset quality:  Gradual Timing:  Constant Progression:  Worsening Chronicity:  New Context: recent infection   Relieved by:  Nothing Worsened by:  Activity Ineffective treatments:  Rest Associated symptoms: cough, diarrhea, fever, myalgias and shortness of breath   Associated symptoms: no abdominal pain, no chest pain, no dysuria and no nausea        Past Medical History:  Diagnosis Date  . Asthma   . Obesity     There are no problems to display for this patient.   Past Surgical History:  Procedure Laterality Date  . dislocated shoulder repair     r  . SHOULDER SURGERY       OB History   No obstetric history on file.     History  reviewed. No pertinent family history.  Social History   Tobacco Use  . Smoking status: Never Smoker  . Smokeless tobacco: Never Used  Vaping Use  . Vaping Use: Never used  Substance Use Topics  . Alcohol use: Yes    Comment: wine occasional  . Drug use: No    Home Medications Prior to Admission medications   Medication Sig Start Date End Date Taking? Authorizing Provider  albuterol (PROVENTIL HFA;VENTOLIN HFA) 108 (90 Base) MCG/ACT inhaler Inhale 1-2 puffs into the lungs every 6 (six) hours as needed for wheezing or shortness of breath. 03/30/16   Rise Mu, PA-C    Allergies    Contrast media [iodinated diagnostic agents] and Percocet [oxycodone-acetaminophen]  Review of Systems   Review of Systems  Constitutional: Positive for activity change, appetite change, fatigue and fever.  HENT: Negative for sore throat.   Eyes: Negative for visual disturbance.  Respiratory: Positive for cough and shortness of breath. Negative for hemoptysis.   Cardiovascular: Negative for chest pain.  Gastrointestinal: Positive for diarrhea. Negative for abdominal pain and nausea.  Genitourinary: Negative for dysuria.  Musculoskeletal: Positive for myalgias.  Skin: Negative for rash.  Neurological: Positive for weakness.    Physical Exam Updated Vital Signs BP 137/76   Pulse 89   Temp 99.5 F (37.5 C) (Oral)   Resp 18   Ht 5\' 2"  (1.575  m)   Wt 119.7 kg   SpO2 94%   BMI 48.29 kg/m   Physical Exam Vitals and nursing note reviewed.  Constitutional:      General: Joan Fisher is not in acute distress.    Appearance: Joan Fisher is well-developed. Joan Fisher is obese.  HENT:     Head: Normocephalic and atraumatic.  Eyes:     Conjunctiva/sclera: Conjunctivae normal.  Cardiovascular:     Rate and Rhythm: Normal rate and regular rhythm.     Heart sounds: No murmur heard.   Pulmonary:     Effort: Pulmonary effort is normal. No respiratory distress.     Breath sounds: Normal breath sounds.    Abdominal:     Palpations: Abdomen is soft.     Tenderness: There is no abdominal tenderness.  Musculoskeletal:     Cervical back: Neck supple.     Right lower leg: No tenderness.     Left lower leg: No tenderness.  Skin:    General: Skin is warm and dry.     Capillary Refill: Capillary refill takes less than 2 seconds.  Neurological:     General: No focal deficit present.     Mental Status: Joan Fisher is alert.     ED Results / Procedures / Treatments   Labs (all labs ordered are listed, but only abnormal results are displayed) Labs Reviewed  BASIC METABOLIC PANEL - Abnormal; Notable for the following components:      Result Value   Glucose, Bld 115 (*)    Calcium 8.8 (*)    All other components within normal limits  CBC WITH DIFFERENTIAL/PLATELET    EKG None  Radiology DG Chest Port 1 View  Result Date: 03/19/2020 CLINICAL DATA:  COVID-19 positive, shortness of breath EXAM: PORTABLE CHEST 1 VIEW COMPARISON:  Radiograph 10/31/2017 FINDINGS: Low volumes and atelectasis. Some hazy and patchy opacities present towards the medial lung bases may reflect vascular crowding/atelectasis or some early airspace disease. No pneumothorax or visible effusion. Cardiomediastinal contours are unremarkable for portable technique. Postsurgical changes of the right glenoid may reflect prior Latarjet procedure. No acute osseous or soft tissue abnormality. Telemetry leads overlie the chest. IMPRESSION: Low volumes and atelectasis. Some hazy and patchy opacities present towards the medial lung bases may reflect vascular crowding/atelectasis or some early airspace disease in the setting of COVID-19. Electronically Signed   By: Kreg Shropshire M.D.   On: 03/19/2020 18:12    Procedures Procedures (including critical care time)  Medications Ordered in ED Medications  sodium chloride 0.9 % bolus 500 mL (0 mLs Intravenous Stopped 03/19/20 1946)  methylPREDNISolone sodium succinate (SOLU-MEDROL) 125 mg/2 mL  injection 125 mg (125 mg Intravenous Given 03/19/20 1801)  bamlanivimab 700 mg, etesevimab 1,400 mg in sodium chloride 0.9 % 160 mL IVPB ( Intravenous Stopped 03/19/20 2028)    ED Course  I have reviewed the triage vital signs and the nursing notes.  Pertinent labs & imaging results that were available during my care of the patient were reviewed by me and considered in my medical decision making (see chart for details).  Clinical Course as of Mar 20 1046  Mon Mar 19, 2020  1816 Chest x-ray interpreted by me as showing some haziness at her bases which may reflect Covid.   [MB]  1958 Mab infusion starting   [MB]  2049 Patient's received Mab infusion. Joan Fisher is still satting well on room air. We discussed follow-up with the pulmonary clinic. Joan Fisher is comfortable plan for outpatient management. We discussed  return instructions.   [MB]    Clinical Course User Index [MB] Terrilee Files, MD   MDM Rules/Calculators/A&P                         Johann Gascoigne was evaluated in Emergency Department on 03/19/2020 for the symptoms described in the history of present illness. Joan Fisher was evaluated in the context of the global COVID-19 pandemic, which necessitated consideration that the patient might be at risk for infection with the SARS-CoV-2 virus that causes COVID-19. Institutional protocols and algorithms that pertain to the evaluation of patients at risk for COVID-19 are in a state of rapid change based on information released by regulatory bodies including the CDC and federal and state organizations. These policies and algorithms were followed during the patient's care in the ED.  This patient complains of fatigue cough shortness of breath poor oral intake in the setting of Covid infection this involves an extensive number of treatment Options and is a complaint that carries with it a high risk of complications and Morbidity. The differential includes Covid, hypoxia, metabolic derangement, pneumonia  I  ordered, reviewed and interpreted labs, which included CBC with normal white count normal hemoglobin, chemistries fairly normal other than mildly elevated glucose I ordered medication IV fluids IV steroids and monoclonal antibody infusion I ordered imaging studies which included chest x-ray and I independently    visualized and interpreted imaging which showed bilateral early infiltrates Previous records obtained and reviewed in epic including ED visits on the 21st on the 24th  After the interventions stated above, I reevaluated the patient and found patient to be hemodynamically stable satting in the mid 90s on room air.  Joan Fisher has some mild increased work of breathing.  Otherwise nontoxic-appearing.  Recommended close follow-up with the Covid clinic and return instructions discussed.   Final Clinical Impression(s) / ED Diagnoses Final diagnoses:  COVID-19 virus infection    Rx / DC Orders ED Discharge Orders         Ordered    predniSONE (DELTASONE) 20 MG tablet  Daily        03/19/20 2051           Terrilee Files, MD 03/20/20 1049

## 2020-03-19 NOTE — ED Notes (Signed)
Assumed care of patient at this time, nad noted, sr up x2, bed locked and low, call bell w/I reach.  Will continue to monitor. ° °

## 2020-03-27 ENCOUNTER — Ambulatory Visit (INDEPENDENT_AMBULATORY_CARE_PROVIDER_SITE_OTHER): Payer: 59 | Admitting: Nurse Practitioner

## 2020-03-27 VITALS — BP 126/72 | HR 85 | Temp 97.5°F | Ht 60.05 in | Wt 262.0 lb

## 2020-03-27 DIAGNOSIS — Z8616 Personal history of COVID-19: Secondary | ICD-10-CM | POA: Diagnosis not present

## 2020-03-27 DIAGNOSIS — R0989 Other specified symptoms and signs involving the circulatory and respiratory systems: Secondary | ICD-10-CM

## 2020-03-27 MED ORDER — MONTELUKAST SODIUM 10 MG PO TABS: 10.0000 mg | ORAL_TABLET | Freq: Every day | ORAL | 3 refills | Status: AC

## 2020-03-27 MED ORDER — BUDESONIDE-FORMOTEROL FUMARATE 80-4.5 MCG/ACT IN AERO: 2.0000 | INHALATION_SPRAY | Freq: Two times a day (BID) | RESPIRATORY_TRACT | 3 refills | Status: AC

## 2020-03-27 NOTE — Assessment & Plan Note (Signed)
Chest congestion History of asthma:   Stay well hydrated  Stay active  Deep breathing exercises  May take tylenol or fever or pain  May take mucinex twice daily  Will order chest x ray  May continue albuterol as needed  Will order Symbicort 2 puffs twice daily  Will order Singulair    Follow up:  Follow up in 2 weeks or sooner if needed

## 2020-03-27 NOTE — Progress Notes (Signed)
@Patient  ID: , female    DOB: 11-07-1960, 59 y.o.   MRN: [id number]  Chief Complaint  Patient presents with  . New Patient (Initial Visit)    COVID 10/21 recieved infusion, feels asthma is flared up    Referring provider: No ref. provider found   59 year old female with history of asthma and obesity.  HPI  Patient presents today for post COVID care clinic visit.  Patient was diagnosed with Covid on 03/15/2020.  She did receive monoclonal antibody infusion in the ED and did complete a round of steroids.  Chest x-ray in the ED did show developing Covid pneumonia.  Patient states she continues to have chest congestion.  She does have albuterol inhaler.  She states that she does have a history of asthma but has not needed inhalers in several years until she became sick with Covid. Denies f/c/s, n/v/d, hemoptysis, PND, chest pain or edema.      Allergies  Allergen Reactions  . Contrast Media [Iodinated Diagnostic Agents] Shortness Of Breath  . Oxycodone-Acetaminophen Nausea And Vomiting    shakes  . Percocet [Oxycodone-Acetaminophen] Nausea And Vomiting    shakes     There is no immunization history on file for this patient.  Past Medical History:  Diagnosis Date  . Asthma   . Obesity     Tobacco History: Social History   Tobacco Use  Smoking Status Never Smoker  Smokeless Tobacco Never Used   Counseling given: Yes   Outpatient Encounter Medications as of 03/27/2020  Medication Sig  . albuterol (PROVENTIL HFA;VENTOLIN HFA) 108 (90 Base) MCG/ACT inhaler Inhale 1-2 puffs into the lungs every 6 (six) hours as needed for wheezing or shortness of breath.  . [DISCONTINUED] albuterol (VENTOLIN HFA) 108 (90 Base) MCG/ACT inhaler Inhale into the lungs.  . budesonide-formoterol (SYMBICORT) 80-4.5 MCG/ACT inhaler Inhale 2 puffs into the lungs 2 (two) times daily.  . montelukast (SINGULAIR) 10 MG tablet Take 1 tablet (10 mg total) by mouth at bedtime.  .  [DISCONTINUED] predniSONE (DELTASONE) 20 MG tablet Take 3 tablets (60 mg total) by mouth daily.   No facility-administered encounter medications on file as of 03/27/2020.     Review of Systems  Review of Systems  Constitutional: Negative.  Negative for fatigue and fever.  HENT: Negative.   Respiratory: Negative for cough and shortness of breath.        Chest congestion / tightness  Cardiovascular: Negative.  Negative for chest pain, palpitations and leg swelling.  Gastrointestinal: Negative.   Allergic/Immunologic: Negative.   Neurological: Negative.   Psychiatric/Behavioral: Negative.        Physical Exam  BP 126/72 (BP Location: Left Arm)   Pulse 85   Temp (!) 97.5 F (36.4 C)   Ht 5' 0.05" (1.525 m)   Wt 262 lb (118.8 kg)   SpO2 97%   BMI 51.08 kg/m   Wt Readings from Last 5 Encounters:  03/27/20 262 lb (118.8 kg)  03/19/20 264 lb (119.7 kg)  03/18/20 264 lb (119.7 kg)  03/15/20 264 lb (119.7 kg)  10/31/17 270 lb (122.5 kg)     Physical Exam Vitals and nursing note reviewed.  Constitutional:      General: She is not in acute distress.    Appearance: She is well-developed.  Cardiovascular:     Rate and Rhythm: Normal rate and regular rhythm.  Pulmonary:     Effort: Pulmonary effort is normal.     Breath sounds: Normal breath sounds.  Neurological:  Mental Status: She is alert and oriented to person, place, and time.  Psychiatric:        Mood and Affect: Mood normal.        Behavior: Behavior normal.     Imaging: DG Chest Port 1 View  Result Date: 03/19/2020 CLINICAL DATA:  COVID-19 positive, shortness of breath EXAM: PORTABLE CHEST 1 VIEW COMPARISON:  Radiograph 10/31/2017 FINDINGS: Low volumes and atelectasis. Some hazy and patchy opacities present towards the medial lung bases may reflect vascular crowding/atelectasis or some early airspace disease. No pneumothorax or visible effusion. Cardiomediastinal contours are unremarkable for portable  technique. Postsurgical changes of the right glenoid may reflect prior Latarjet procedure. No acute osseous or soft tissue abnormality. Telemetry leads overlie the chest. IMPRESSION: Low volumes and atelectasis. Some hazy and patchy opacities present towards the medial lung bases may reflect vascular crowding/atelectasis or some early airspace disease in the setting of COVID-19. Electronically Signed   By: Kreg Shropshire M.D.   On: 03/19/2020 18:12     Assessment & Plan:   History of COVID-19 Chest congestion History of asthma:   Stay well hydrated  Stay active  Deep breathing exercises  May take tylenol or fever or pain  May take mucinex twice daily  Will order chest x ray  May continue albuterol as needed  Will order Symbicort 2 puffs twice daily  Will order Singulair    Follow up:  Follow up in 2 weeks or sooner if needed      Ivonne Andrew, NP 03/27/2020

## 2020-03-27 NOTE — Patient Instructions (Addendum)
Covid 19 Chest congestion History of asthma:   Stay well hydrated  Stay active  Deep breathing exercises  May take tylenol or fever or pain  May take mucinex twice daily  Will order chest x ray  May continue albuterol as needed  Will order Symbicort 2 puffs twice daily  Will order Singulair    Follow up:  Follow up in 2 weeks or sooner if needed

## 2020-04-02 ENCOUNTER — Ambulatory Visit
Admission: RE | Admit: 2020-04-02 | Discharge: 2020-04-02 | Disposition: A | Discharge status: Home / Self Care | Payer: 59 | Source: Ambulatory Visit | Attending: Nurse Practitioner | Admitting: Nurse Practitioner

## 2020-04-03 ENCOUNTER — Telehealth: Payer: Self-pay | Admitting: Nurse Practitioner

## 2020-04-03 NOTE — Telephone Encounter (Signed)
-----   Message from Ivonne Andrew, NP sent at 04/03/2020  9:28 AM EST ----- Please call to let patient know that her chest xray did still show some pneumonia. It may take a few more weeks for that to completely clear. How is she doing now?

## 2020-04-03 NOTE — Telephone Encounter (Signed)
Patient notified of results, verbally understood. No additional questions.

## 2020-04-10 ENCOUNTER — Ambulatory Visit: Payer: Self-pay

## 2020-05-24 ENCOUNTER — Other Ambulatory Visit: Payer: Self-pay

## 2020-05-24 ENCOUNTER — Emergency Department (HOSPITAL_COMMUNITY)
Admission: EM | Admit: 2020-05-24 | Discharge: 2020-05-24 | Disposition: A | Discharge status: Home / Self Care | Payer: 59 | Attending: Emergency Medicine | Admitting: Emergency Medicine

## 2020-05-24 ENCOUNTER — Encounter (HOSPITAL_COMMUNITY): Payer: Self-pay | Admitting: Emergency Medicine

## 2020-05-24 ENCOUNTER — Emergency Department (HOSPITAL_COMMUNITY): Payer: 59

## 2020-05-24 DIAGNOSIS — J4521 Mild intermittent asthma with (acute) exacerbation: Secondary | ICD-10-CM | POA: Insufficient documentation

## 2020-05-24 DIAGNOSIS — Z7952 Long term (current) use of systemic steroids: Secondary | ICD-10-CM | POA: Diagnosis not present

## 2020-05-24 DIAGNOSIS — R0602 Shortness of breath: Secondary | ICD-10-CM | POA: Diagnosis present

## 2020-05-24 DIAGNOSIS — D72819 Decreased white blood cell count, unspecified: Secondary | ICD-10-CM | POA: Diagnosis not present

## 2020-05-24 DIAGNOSIS — Z8616 Personal history of COVID-19: Secondary | ICD-10-CM | POA: Diagnosis not present

## 2020-05-24 LAB — BASIC METABOLIC PANEL
Anion gap: 5 (ref 5–15)
BUN: 16 mg/dL (ref 6–20)
CO2: 31 mmol/L (ref 22–32)
Calcium: 9.4 mg/dL (ref 8.9–10.3)
Chloride: 107 mmol/L (ref 98–111)
Creatinine, Ser: 0.87 mg/dL (ref 0.44–1.00)
GFR, Estimated: >60 mL/min (ref >60)
Glucose, Bld: 129 mg/dL — ABNORMAL HIGH (ref 70–99)
Potassium: 4.3 mmol/L (ref 3.5–5.1)
Sodium: 143 mmol/L (ref 135–145)

## 2020-05-24 LAB — CBC
HCT: 40.7 % (ref 36.0–46.0)
Hemoglobin: 12.9 g/dL (ref 12.0–15.0)
MCH: 29.6 pg (ref 26.0–34.0)
MCHC: 31.7 g/dL (ref 30.0–36.0)
MCV: 93.3 fL (ref 80.0–100.0)
Platelets: 219 10*3/uL (ref 150–400)
RBC: 4.36 MIL/uL (ref 3.87–5.11)
RDW: 13.9 % (ref 11.5–15.5)
WBC: 3.9 10*3/uL — ABNORMAL LOW (ref 4.0–10.5)
nRBC: 0 % (ref 0.0–0.2)

## 2020-05-24 MED ORDER — ALBUTEROL SULFATE HFA 108 (90 BASE) MCG/ACT IN AERS
6.0000 | INHALATION_SPRAY | Freq: Once | RESPIRATORY_TRACT | Status: AC
  Administered 2020-05-24: 6 via RESPIRATORY_TRACT
  Filled 2020-05-24: qty 6.7

## 2020-05-24 MED ORDER — PREDNISONE 10 MG PO TABS: 40.0000 mg | ORAL_TABLET | Freq: Every day | ORAL | 0 refills | Status: AC

## 2020-05-24 MED ORDER — AEROCHAMBER PLUS FLO-VU MEDIUM MISC
1.0000 | Freq: Once | Status: AC
  Administered 2020-05-24: 1
  Filled 2020-05-24 (×2): qty 1

## 2020-05-24 MED ORDER — PREDNISONE 20 MG PO TABS
60.0000 mg | ORAL_TABLET | Freq: Once | ORAL | Status: AC
  Administered 2020-05-24: 60 mg via ORAL
  Filled 2020-05-24: qty 3

## 2020-05-24 MED ORDER — IPRATROPIUM BROMIDE HFA 17 MCG/ACT IN AERS
2.0000 | INHALATION_SPRAY | Freq: Once | RESPIRATORY_TRACT | Status: AC
  Administered 2020-05-24: 2 via RESPIRATORY_TRACT
  Filled 2020-05-24: qty 12.9

## 2020-05-24 NOTE — ED Triage Notes (Signed)
Patient states she started using her pump yesterday. Patient is not having any difficulty at this point. Patient states it is hurting under her left breast when she breathe.

## 2020-05-24 NOTE — Discharge Instructions (Addendum)
Please use the prednisone as prescribed.  Please use the albuterol inhaler that I have provided you using the spacer as well.  Please use the ipratropium/Atrovent inhaler 2 puffs daily if needed.  If your symptoms worsen or persist you may always return to the ER for reevaluation. Please drink plenty of water.  Please follow-up with primary care doctor for reevaluation.

## 2020-05-24 NOTE — ED Provider Notes (Addendum)
[facility]-EMERGENCY DEPT Provider Note   CSN: [id number] Arrival date & time: 05/24/20  5003     History Chief Complaint  Patient presents with  . Asthma    Joan Fisher is a 59 y.o. female.  HPI Patient is a 59 year old female presented today to the ER with chief complaint of shortness of breath.  She states that her symptoms began yesterday.  She has a history of asthma and she is obese.  She also was affected with COVID-19 several months ago and had a complete recovery.  She is presented today with some shortness of breath and wheezing.  She states that she has only used her albuterol inhaler.  She only had mild improvement with this.  She states she does not feel short of breath at rest but only when she is exerting herself.  She denies any chest pain.  She denies any cough, congestion, fevers, chills.  She states that she has no myalgias either.  She had COVID-19 earlier this year.  She states it feels nothing like that.  She declined COVID-19 testing.  She states she is otherwise feeling well.  No other associate symptoms.  No aggravating mitigating factors apart from feeling short of breath when she exerts herself.  No paroxysmal nocturnal dyspnea or orthopnea.  She denies any leg swelling either unilateral or bilateral.  No recent surgeries, hospitalization, long travel, hemoptysis, estrogen containing OCP, cancer history.  No unilateral leg swelling.  No history of PE or VTE.    Past Medical History:  Diagnosis Date  . Asthma   . Obesity     Patient Active Problem List   Diagnosis Date Noted  . History of COVID-19 03/27/2020  . Chest congestion 03/27/2020    Past Surgical History:  Procedure Laterality Date  . dislocated shoulder repair     r  . SHOULDER SURGERY       OB History   No obstetric history on file.     History reviewed. No pertinent family history.  Social History   Tobacco Use  . Smoking status: Never Smoker  .  Smokeless tobacco: Never Used  Vaping Use  . Vaping Use: Never used  Substance Use Topics  . Alcohol use: Yes    Comment: wine occasional  . Drug use: No    Home Medications Prior to Admission medications   Medication Sig Start Date End Date Taking? Authorizing Provider  predniSONE (DELTASONE) 10 MG tablet Take 4 tablets (40 mg total) by mouth daily for 3 days. 05/24/20 05/27/20 Yes Maryalice Pasley S, PA  albuterol (PROVENTIL HFA;VENTOLIN HFA) 108 (90 Base) MCG/ACT inhaler Inhale 1-2 puffs into the lungs every 6 (six) hours as needed for wheezing or shortness of breath. 03/30/16   Rise Mu, PA-C  budesonide-formoterol (SYMBICORT) 80-4.5 MCG/ACT inhaler Inhale 2 puffs into the lungs 2 (two) times daily. 03/27/20   Ivonne Andrew, NP  montelukast (SINGULAIR) 10 MG tablet Take 1 tablet (10 mg total) by mouth at bedtime. 03/27/20   Ivonne Andrew, NP    Allergies    Contrast media [iodinated diagnostic agents], Oxycodone-acetaminophen, and Percocet [oxycodone-acetaminophen]  Review of Systems   Review of Systems  Constitutional: Positive for fatigue. Negative for chills and fever.  HENT: Negative for congestion.   Eyes: Negative for pain.  Respiratory: Positive for chest tightness and shortness of breath. Negative for cough.   Cardiovascular: Negative for chest pain and leg swelling.  Gastrointestinal: Negative for abdominal pain, diarrhea, nausea and  vomiting.  Genitourinary: Negative for dysuria.  Musculoskeletal: Negative for myalgias.  Skin: Negative for rash.  Neurological: Negative for dizziness and headaches.    Physical Exam Updated Vital Signs BP (!) 130/93   Pulse 74   Temp 98.7 F (37.1 C) (Oral)   Resp 16   Ht 5\' 2"  (1.575 m)   Wt 120.2 kg   SpO2 97%   BMI 48.47 kg/m   Physical Exam Vitals and nursing note reviewed.  Constitutional:      General: She is not in acute distress.    Comments: Pleasant well-appearing 59 year old.  In no acute distress.   Sitting comfortably in bed.  Able answer questions appropriately follow commands. No increased work of breathing. Speaking in full sentences.  HENT:     Head: Normocephalic and atraumatic.     Nose: Nose normal.  Eyes:     General: No scleral icterus. Cardiovascular:     Rate and Rhythm: Normal rate and regular rhythm.     Pulses: Normal pulses.     Heart sounds: Normal heart sounds.  Pulmonary:     Effort: Pulmonary effort is normal. No respiratory distress.     Breath sounds: Wheezing present.     Comments: Faint end expiratory wheezing.  No tachypnea.  No increased work of breathing. Abdominal:     Palpations: Abdomen is soft.     Tenderness: There is no abdominal tenderness.  Musculoskeletal:     Cervical back: Normal range of motion.     Right lower leg: No edema.     Left lower leg: No edema.  Skin:    General: Skin is warm and dry.     Capillary Refill: Capillary refill takes less than 2 seconds.  Neurological:     Mental Status: She is alert. Mental status is at baseline.  Psychiatric:        Mood and Affect: Mood normal.        Behavior: Behavior normal.     ED Results / Procedures / Treatments   Labs (all labs ordered are listed, but only abnormal results are displayed) Labs Reviewed  BASIC METABOLIC PANEL - Abnormal; Notable for the following components:      Result Value   Glucose, Bld 129 (*)    All other components within normal limits  CBC - Abnormal; Notable for the following components:   WBC 3.9 (*)    All other components within normal limits    EKG EKG Interpretation  Date/Time:  Thursday May 24 2020 09:15:11 EST Ventricular Rate:  69 PR Interval:    QRS Duration: 84 QT Interval:  385 QTC Calculation: 413 R Axis:   33 Text Interpretation: Sinus rhythm Abnormal R-wave progression, early transition Minimal ST elevation, inferior leads No significant change since last tracing Confirmed by [id number] [phone]) on 05/24/2020 9:18:10  AM   Radiology DG Chest Portable 1 View  Result Date: 05/24/2020 CLINICAL DATA:  SOB, cough EXAM: PORTABLE CHEST 1 VIEW COMPARISON:  04/02/2020 and prior. FINDINGS: Mild hypoinflation. No pneumothorax or pleural effusion. No focal consolidation. Cardiomediastinal silhouette within normal limits. Right shoulder postsurgical sequela. IMPRESSION: No focal airspace disease. Electronically Signed   By: 13/12/2019 M.D.   On: 05/24/2020 09:13    Procedures Procedures (including critical care time)  Medications Ordered in ED Medications  albuterol (VENTOLIN HFA) 108 (90 Base) MCG/ACT inhaler 6 puff (6 puffs Inhalation Given 05/24/20 0910)  AeroChamber Plus Flo-Vu Medium MISC 1 each (1 each Other Given 05/24/20 0914)  ipratropium (ATROVENT HFA) inhaler 2 puff (2 puffs Inhalation Given 05/24/20 0910)  predniSONE (DELTASONE) tablet 60 mg (60 mg Oral Given 05/24/20 0909)    ED Course  I have reviewed the triage vital signs and the nursing notes.  Pertinent labs & imaging results that were available during my care of the patient were reviewed by me and considered in my medical decision making (see chart for details).    MDM Rules/Calculators/A&P                          Patient is a 59 year old female with a history of asthma and obesity.  She is presented today with some shortness of breath and wheezing.  She states that she has only used her albuterol inhaler.  She only had mild improvement with this.  She states she does not feel short of breath at rest but only when she is exerting herself.  She denies any chest pain.  Physical exam is notable for expiratory wheezing.  She has no increased work of breathing or no tachypnea.  Her oxygen saturations are greater than 97% on room air.  She is not tachypneic.  BMP and CBC without significant abnormalities.  Mild leukopenia on CBC but stable from prior.  Chest x-ray without any focal abnormalities.  EKG normal sinus rhythm with no ST-T  wave abnormalities.  Normal axis.  Normal R wave progression.  No evidence of ischemia.  Reassessed patient she is no longer expiratory wheezing.  Discharge patient with new albuterol inhaler and spacer. She will follow up with her primary care doctor.  Discharged with short course of prednisone.  Final Clinical Impression(s) / ED Diagnoses Final diagnoses:  Mild intermittent asthma with exacerbation    Rx / DC Orders ED Discharge Orders         Ordered    predniSONE (DELTASONE) 10 MG tablet  Daily        05/24/20 0919           Gailen Shelter, PA 05/24/20 0930    Gailen Shelter, [place] 05/24/20 1152    Pollyann Savoy, MD 05/24/20 1343

## 2020-12-20 ENCOUNTER — Emergency Department (HOSPITAL_COMMUNITY): Payer: Self-pay

## 2020-12-20 ENCOUNTER — Other Ambulatory Visit: Payer: Self-pay

## 2020-12-20 ENCOUNTER — Encounter (HOSPITAL_COMMUNITY): Payer: Self-pay

## 2020-12-20 ENCOUNTER — Emergency Department (HOSPITAL_COMMUNITY)
Admission: EM | Admit: 2020-12-20 | Discharge: 2020-12-20 | Disposition: A | Discharge status: Home / Self Care | Payer: Self-pay | Attending: Emergency Medicine | Admitting: Emergency Medicine

## 2020-12-20 DIAGNOSIS — S8001XA Contusion of right knee, initial encounter: Secondary | ICD-10-CM | POA: Insufficient documentation

## 2020-12-20 DIAGNOSIS — Z8616 Personal history of COVID-19: Secondary | ICD-10-CM | POA: Insufficient documentation

## 2020-12-20 DIAGNOSIS — W19XXXA Unspecified fall, initial encounter: Secondary | ICD-10-CM

## 2020-12-20 DIAGNOSIS — Y92000 Kitchen of unspecified non-institutional (private) residence as  the place of occurrence of the external cause: Secondary | ICD-10-CM | POA: Insufficient documentation

## 2020-12-20 DIAGNOSIS — Y990 Civilian activity done for income or pay: Secondary | ICD-10-CM | POA: Insufficient documentation

## 2020-12-20 DIAGNOSIS — J45909 Unspecified asthma, uncomplicated: Secondary | ICD-10-CM | POA: Insufficient documentation

## 2020-12-20 DIAGNOSIS — W01198A Fall on same level from slipping, tripping and stumbling with subsequent striking against other object, initial encounter: Secondary | ICD-10-CM | POA: Insufficient documentation

## 2020-12-20 DIAGNOSIS — Z7951 Long term (current) use of inhaled steroids: Secondary | ICD-10-CM | POA: Insufficient documentation

## 2020-12-20 NOTE — Discharge Instructions (Addendum)
You are seen in the emergency part for evaluation of right knee pain after a fall.  Your x-rays did not show any fracture or dislocation.  Please use ice and Tylenol and ibuprofen.  Weightbearing as tolerated.  Follow-up with your doctor.  Return to the emergency department if any worsening or concerning symptoms

## 2020-12-20 NOTE — ED Triage Notes (Signed)
Pt states she slipped and fell at work yesterday, c/o rt knee pain and swelling. Denies hearing a pop, c/o pain when walking. Denies loc, denies anticoags

## 2020-12-20 NOTE — ED Notes (Signed)
Knee sleeve applied to pt rt knee, instructed pt how to put it on and remove it/adjust it, and to keep it dry. Pt understands

## 2020-12-20 NOTE — ED Provider Notes (Signed)
[facility]-EMERGENCY DEPT Provider Note   CSN: [id number] Arrival date & time: 12/20/20  9798     History Chief Complaint  Patient presents with   Joan Fisher    [name] is a 60 y.o. female.  She said she slipped and fell at work yesterday.  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  Complaining of pain with ambulation.  Denies any loss of consciousness did not strike her head or neck.  Not on any blood thinners.  No numbness or weakness.  The history is provided by the patient.  Fall This is a new problem. The current episode started yesterday. The problem has not changed since onset.Pertinent negatives include no chest pain, no abdominal pain, no headaches and no shortness of breath. Associated symptoms comments: R knee. The symptoms are aggravated by bending, walking and standing. The symptoms are relieved by rest. She has tried rest for the symptoms. The treatment provided mild relief.      Past Medical History:  Diagnosis Date   Asthma    Obesity     Patient Active Problem List   Diagnosis Date Noted   History of COVID-19 03/27/2020   Chest congestion 03/27/2020    Past Surgical History:  Procedure Laterality Date   dislocated shoulder repair     r   SHOULDER SURGERY       OB History   No obstetric history on file.     No family history on file.  Social History   Tobacco Use   Smoking status: Never   Smokeless tobacco: Never  Vaping Use   Vaping Use: Never used  Substance Use Topics   Alcohol use: Yes    Comment: wine occasional   Drug use: No    Home Medications Prior to Admission medications   Medication Sig Start Date End Date Taking? Authorizing Provider  albuterol (PROVENTIL HFA;VENTOLIN HFA) 108 (90 Base) MCG/ACT inhaler Inhale 1-2 puffs into the lungs every 6 (six) hours as needed for wheezing or shortness of breath. 03/30/16   Rise Mu, PA-C   budesonide-formoterol (SYMBICORT) 80-4.5 MCG/ACT inhaler Inhale 2 puffs into the lungs 2 (two) times daily. 03/27/20   Ivonne Andrew, NP  montelukast (SINGULAIR) 10 MG tablet Take 1 tablet (10 mg total) by mouth at bedtime. 03/27/20   Ivonne Andrew, NP    Allergies    Contrast media [iodinated diagnostic agents], Oxycodone-acetaminophen, and Percocet [oxycodone-acetaminophen]  Review of Systems   Review of Systems  Constitutional:  Negative for fever.  Respiratory:  Negative for shortness of breath.   Cardiovascular:  Negative for chest pain.  Gastrointestinal:  Negative for abdominal pain.  Musculoskeletal:  Positive for gait problem.  Skin:  Negative for wound.  Neurological:  Negative for weakness, numbness and headaches.   Physical Exam Updated Vital Signs BP (!) 196/76 (BP Location: Right Arm)   Pulse 76   Temp 98.3 F (36.8 C) (Oral)   Resp 18   Ht 5\' 2"  (1.575 m)   Wt 116.1 kg   SpO2 100%   BMI 46.82 kg/m   Physical Exam Constitutional:      Appearance: Normal appearance. She is well-developed.  HENT:     Head: Normocephalic and atraumatic.  Eyes:     Conjunctiva/sclera: Conjunctivae normal.  Musculoskeletal:        General: Tenderness present. No deformity. Normal range of motion.     Cervical back:  Neck supple.     Comments: Right hip and ankle nontender.  She is diffusely tender around her knee mostly on the lateral aspect.  There is no significant swelling or erythema.  No warmth.  Full range of motion.  No ligamentous laxity.  Skin:    General: Skin is warm and dry.  Neurological:     Mental Status: She is alert.     GCS: GCS eye subscore is 4. GCS verbal subscore is 5. GCS motor subscore is 6.    ED Results / Procedures / Treatments   Labs (all labs ordered are listed, but only abnormal results are displayed) Labs Reviewed - No data to display  EKG None  Radiology DG Knee Complete 4 Views Right  Result Date: 12/20/2020 CLINICAL DATA:   Fall.  Pain.  Swelling. EXAM: RIGHT KNEE - COMPLETE 4+ VIEW COMPARISON:  No prior. FINDINGS: Tricompartment degenerative change. No evidence of acute fracture or dislocation. Tiny knee joint effusion cannot be excluded. IMPRESSION: Degenerative change. No acute bony abnormality identified. Tiny knee joint effusion cannot be excluded. Electronically Signed   By: Maisie Fus  Register   On: 12/20/2020 08:10    Procedures Procedures   Medications Ordered in ED Medications - No data to display  ED Course  I have reviewed the triage vital signs and the nursing notes.  Pertinent labs & imaging results that were available during my care of the patient were reviewed by me and considered in my medical decision making (see chart for details).    MDM Rules/Calculators/A&P                           60 year old female here for evaluation of right knee injury after mechanical fall slipping on some water.  She is diffusely tender in the knee although primarily in the lateral aspect.  No obvious ligamentous laxity or significant effusion.  X-rays show degenerative changes no fracture or dislocation.  Reviewed with patient.  Will place in knee sleeve and recommended close follow-up with PCP.  Return instructions discussed.  Work note given Final Clinical Impression(s) / ED Diagnoses Final diagnoses:  Fall, initial encounter  Contusion of right knee, initial encounter    Rx / DC Orders ED Discharge Orders     None        Terrilee Files, MD 12/20/20 [phone]

## 2021-03-28 ENCOUNTER — Encounter (HOSPITAL_COMMUNITY): Payer: Self-pay

## 2021-03-28 ENCOUNTER — Emergency Department (HOSPITAL_COMMUNITY)
Admission: EM | Admit: 2021-03-28 | Discharge: 2021-03-28 | Disposition: A | Discharge status: Home / Self Care | Payer: Self-pay | Attending: Emergency Medicine | Admitting: Emergency Medicine

## 2021-03-28 DIAGNOSIS — M545 Low back pain, unspecified: Secondary | ICD-10-CM | POA: Insufficient documentation

## 2021-03-28 DIAGNOSIS — Y990 Civilian activity done for income or pay: Secondary | ICD-10-CM | POA: Insufficient documentation

## 2021-03-28 DIAGNOSIS — X500XXA Overexertion from strenuous movement or load, initial encounter: Secondary | ICD-10-CM | POA: Insufficient documentation

## 2021-03-28 DIAGNOSIS — Z7951 Long term (current) use of inhaled steroids: Secondary | ICD-10-CM | POA: Insufficient documentation

## 2021-03-28 DIAGNOSIS — J45909 Unspecified asthma, uncomplicated: Secondary | ICD-10-CM | POA: Insufficient documentation

## 2021-03-28 DIAGNOSIS — Z8616 Personal history of COVID-19: Secondary | ICD-10-CM | POA: Insufficient documentation

## 2021-03-28 DIAGNOSIS — Y93F2 Activity, caregiving, lifting: Secondary | ICD-10-CM | POA: Insufficient documentation

## 2021-03-28 DIAGNOSIS — Y9289 Other specified places as the place of occurrence of the external cause: Secondary | ICD-10-CM | POA: Insufficient documentation

## 2021-03-28 LAB — URINALYSIS, ROUTINE W REFLEX MICROSCOPIC
Bilirubin Urine: NEGATIVE
Glucose, UA: NEGATIVE mg/dL
Hgb urine dipstick: NEGATIVE
Ketones, ur: NEGATIVE mg/dL
Leukocytes,Ua: NEGATIVE
Nitrite: NEGATIVE
Protein, ur: NEGATIVE mg/dL
Specific Gravity, Urine: 1.012 (ref 1.005–1.030)
pH: 5 (ref 5.0–8.0)

## 2021-03-28 NOTE — Discharge Instructions (Signed)
It appears that you have low back pain due to arthritis.  For pain use Tylenol or Motrin.  Also heating pad on the sore area can help.  Avoid lifting as much as possible.  Use the attached resource guide, to help you find a primary care doctor for further care and treatment as needed.

## 2021-03-28 NOTE — ED Provider Notes (Signed)
[facility]-EMERGENCY DEPT Provider Note   CSN: [id number] Arrival date & time: 03/28/21  0703     History Chief Complaint  Patient presents with   Flank Pain    Joan Fisher is a 60 y.o. female.  HPI She presents for evaluation of low back pain present for several days after lifting some cans at work.  She works as a Production assistant, radio.  She denies radicular pain, fever, chills, weakness or dizziness.  No prior similar problems.  There are no other known active modifying factors.    Past Medical History:  Diagnosis Date   Asthma    Obesity     Patient Active Problem List   Diagnosis Date Noted   History of COVID-19 03/27/2020   Chest congestion 03/27/2020    Past Surgical History:  Procedure Laterality Date   dislocated shoulder repair     r   SHOULDER SURGERY       OB History   No obstetric history on file.     History reviewed. No pertinent family history.  Social History   Tobacco Use   Smoking status: Never   Smokeless tobacco: Never  Vaping Use   Vaping Use: Never used  Substance Use Topics   Alcohol use: Yes    Comment: wine occasional   Drug use: No    Home Medications Prior to Admission medications   Medication Sig Start Date End Date Taking? Authorizing Provider  albuterol (PROVENTIL HFA;VENTOLIN HFA) 108 (90 Base) MCG/ACT inhaler Inhale 1-2 puffs into the lungs every 6 (six) hours as needed for wheezing or shortness of breath. 03/30/16   Rise Mu, PA-C  budesonide-formoterol (SYMBICORT) 80-4.5 MCG/ACT inhaler Inhale 2 puffs into the lungs 2 (two) times daily. 03/27/20   Ivonne Andrew, NP  montelukast (SINGULAIR) 10 MG tablet Take 1 tablet (10 mg total) by mouth at bedtime. 03/27/20   Ivonne Andrew, NP    Allergies    Contrast media [iodinated diagnostic agents], Oxycodone-acetaminophen, and Percocet [oxycodone-acetaminophen]  Review of Systems   Review of Systems  All other systems reviewed and are  negative.  Physical Exam Updated Vital Signs BP (!) 191/79   Pulse (!) 57   Temp 97.9 F (36.6 C) (Oral)   Resp 17   Ht 5\' 2"  (1.575 m)   Wt 115.2 kg   SpO2 100%   BMI 46.46 kg/m   Physical Exam Vitals and nursing note reviewed.  Constitutional:      Appearance: She is well-developed. She is not ill-appearing.  HENT:     Head: Normocephalic and atraumatic.     Right Ear: External ear normal.     Left Ear: External ear normal.  Eyes:     Conjunctiva/sclera: Conjunctivae normal.     Pupils: Pupils are equal, round, and reactive to light.  Neck:     Trachea: Phonation normal.  Cardiovascular:     Rate and Rhythm: Normal rate.  Pulmonary:     Effort: Pulmonary effort is normal.  Abdominal:     Tenderness: There is no abdominal tenderness.  Musculoskeletal:        General: Normal range of motion.     Cervical back: Normal range of motion and neck supple.     Comments: Sitting and partially reclined on stretcher, no limitations of motion of the extremities or back.  Skin:    General: Skin is warm and dry.  Neurological:     Mental Status: She is alert and oriented to person,  place, and time.     Cranial Nerves: No cranial nerve deficit.     Sensory: No sensory deficit.     Motor: No abnormal muscle tone.     Coordination: Coordination normal.  Psychiatric:        Mood and Affect: Mood normal.        Behavior: Behavior normal.        Thought Content: Thought content normal.        Judgment: Judgment normal.    ED Results / Procedures / Treatments   Labs (all labs ordered are listed, but only abnormal results are displayed) Labs Reviewed  URINALYSIS, ROUTINE W REFLEX MICROSCOPIC - Abnormal; Notable for the following components:      Result Value   Color, Urine STRAW (*)    All other components within normal limits    EKG None  Radiology No results found.  Procedures Procedures   Medications Ordered in ED Medications - No data to display  ED Course   I have reviewed the triage vital signs and the nursing notes.  Pertinent labs & imaging results that were available during my care of the patient were reviewed by me and considered in my medical decision making (see chart for details).    MDM Rules/Calculators/A&P                            Patient Vitals for the past 24 hrs:  BP Temp Temp src Pulse Resp SpO2 Height Weight  03/28/21 0815 -- -- -- (!) 57 -- 100 % 5\' 2"  (1.575 m) 115.2 kg  03/28/21 0809 (!) 191/79 97.9 F (36.6 C) Oral -- -- -- -- --  03/28/21 0719 (!) 166/85 98.2 F (36.8 C) Oral 61 17 100 % -- --    9:37 AM Reevaluation with update and discussion. After initial assessment and treatment, an updated evaluation reveals no change in clinical status.  5 discussed with the patient and all questions answered. 13/03/22   Medical Decision Making:  This patient is presenting for evaluation of low back pain without significant trauma, which does not require a range of treatment options, and is not a complaint that involves a high risk of morbidity and mortality. The differential diagnoses include muscle pain, arthritis, nerve impingement. I decided to review old records, and in summary obese female presenting with pain which started at work after lifting cans.  No red flags.  I did not require additional historical information from anyone.   Critical Interventions-clinical evaluation, discussion with patient  After These Interventions, the Patient was reevaluated and was found stable for discharge.  Patient with high likelihood for arthritic pain, and low likelihood for myelopathy or significant arthropathy.  Stable for discharge with symptomatic treatment.  CRITICAL CARE-no Performed by: Mancel Bale  Nursing Notes Reviewed/ Care Coordinated Applicable Imaging Reviewed Interpretation of Laboratory Data incorporated into ED treatment  The patient appears reasonably screened and/or stabilized for discharge and I  doubt any other medical condition or other [facility] requiring further screening, evaluation, or treatment in the ED at this time prior to discharge.  Plan: Home Medications-continue routine and use Tylenol or Motrin for pain; Home Treatments-heat and rest; return here if the recommended treatment, does not improve the symptoms; Recommended follow up-PCP, PRN     Final Clinical Impression(s) / ED Diagnoses Final diagnoses:  Left-sided low back pain without sciatica, unspecified chronicity    Rx / DC Orders ED Discharge Orders  None        Mancel Bale, MD 03/28/21 [phone]

## 2021-03-28 NOTE — ED Notes (Signed)
EDP the bedside to evaluate.

## 2021-03-28 NOTE — ED Triage Notes (Signed)
Pt presents with c/o left flank pain that started last night. Pt denies any hx of kidney stones and hematuria. Pt denies any injury to that area or fall.

## 2021-05-06 ENCOUNTER — Emergency Department (HOSPITAL_COMMUNITY)
Admission: EM | Admit: 2021-05-06 | Discharge: 2021-05-06 | Disposition: A | Discharge status: Home / Self Care | Payer: Self-pay | Attending: Emergency Medicine | Admitting: Emergency Medicine

## 2021-05-06 ENCOUNTER — Encounter (HOSPITAL_COMMUNITY): Payer: Self-pay

## 2021-05-06 ENCOUNTER — Other Ambulatory Visit: Payer: Self-pay

## 2021-05-06 ENCOUNTER — Emergency Department (HOSPITAL_COMMUNITY): Payer: Self-pay

## 2021-05-06 DIAGNOSIS — Z8616 Personal history of COVID-19: Secondary | ICD-10-CM | POA: Insufficient documentation

## 2021-05-06 DIAGNOSIS — Z760 Encounter for issue of repeat prescription: Secondary | ICD-10-CM | POA: Insufficient documentation

## 2021-05-06 DIAGNOSIS — Z7951 Long term (current) use of inhaled steroids: Secondary | ICD-10-CM | POA: Insufficient documentation

## 2021-05-06 DIAGNOSIS — J029 Acute pharyngitis, unspecified: Secondary | ICD-10-CM | POA: Insufficient documentation

## 2021-05-06 DIAGNOSIS — Z20822 Contact with and (suspected) exposure to covid-19: Secondary | ICD-10-CM | POA: Insufficient documentation

## 2021-05-06 DIAGNOSIS — J4541 Moderate persistent asthma with (acute) exacerbation: Secondary | ICD-10-CM | POA: Insufficient documentation

## 2021-05-06 LAB — RESP PANEL BY RT-PCR (FLU A&B, COVID) ARPGX2
Influenza A by PCR: NEGATIVE
Influenza B by PCR: NEGATIVE
SARS Coronavirus 2 by RT PCR: NEGATIVE

## 2021-05-06 MED ORDER — ALBUTEROL SULFATE (2.5 MG/3ML) 0.083% IN NEBU: 2.5000 mg | INHALATION_SOLUTION | Freq: Four times a day (QID) | RESPIRATORY_TRACT | 0 refills | Status: DC | PRN

## 2021-05-06 MED ORDER — PREDNISONE 20 MG PO TABS
60.0000 mg | ORAL_TABLET | Freq: Once | ORAL | Status: AC
  Administered 2021-05-06: 60 mg via ORAL
  Filled 2021-05-06: qty 3

## 2021-05-06 MED ORDER — ALBUTEROL SULFATE HFA 108 (90 BASE) MCG/ACT IN AERS
2.0000 | INHALATION_SPRAY | RESPIRATORY_TRACT | Status: DC | PRN
  Administered 2021-05-06: 2 via RESPIRATORY_TRACT
  Filled 2021-05-06: qty 6.7

## 2021-05-06 MED ORDER — ALBUTEROL SULFATE HFA 108 (90 BASE) MCG/ACT IN AERS: 1.0000 | INHALATION_SPRAY | Freq: Four times a day (QID) | RESPIRATORY_TRACT | 1 refills | Status: DC | PRN

## 2021-05-06 MED ORDER — PREDNISONE 10 MG (21) PO TBPK: ORAL_TABLET | Freq: Every day | ORAL | 0 refills | Status: DC

## 2021-05-06 MED ORDER — IPRATROPIUM-ALBUTEROL 0.5-2.5 (3) MG/3ML IN SOLN
3.0000 mL | Freq: Once | RESPIRATORY_TRACT | Status: AC
  Administered 2021-05-06: 3 mL via RESPIRATORY_TRACT
  Filled 2021-05-06: qty 3

## 2021-05-06 NOTE — ED Triage Notes (Addendum)
Patient reports that she has been out of her Albuterol inhaler and Albuterol for her nebulizer x 2 days.   Patient added that she has had a sore throat x 2 days.

## 2021-05-06 NOTE — ED Provider Notes (Signed)
[facility]-EMER[facility]ENC[facility] DEPT Provider Note   CSN: [id number] Arrival date & time: 05/06/21  6160     History Chief Complaint  Patient presents with   Asthma   Medication Refill   Sore Throat    Joan Fisher is a 60 y.o. female with history of asthma who presents with concern for wheezing and shortness of breath for the last 2 days.  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  States she has had a sore throat for the last couple of days with mild congestion without runny nose, fevers, chills, nausea, vomiting, or diarrhea.  She states she tested negative for COVID-19 in the outpatient setting and has no known exposures to influenza.  She states she ran out of both her albuterol nebulizer treatments and her inhaler which she usually uses during times of her asthma exacerbations with significant provement.  States she has had only 1 exacerbations in the last year and has never required admission to the hospital for asthma exacerbation. No recent prolonged immobilization, surgeries, history of DVT, hormone replacement therapy, or history of malignancy.  I personally reviewed this patient's medical records.  She is on Symbicort Singulair daily with as needed albuterol.  HPI     Past Medical History:  Diagnosis Date   Asthma    Obesity     Patient Active Problem List   Diagnosis Date Noted   History of COVID-19 03/27/2020   Chest congestion 03/27/2020    Past Surgical History:  Procedure Laterality Date   dislocated shoulder repair     r   SHOULDER SURGERY       OB History   No obstetric history on file.     History reviewed. No pertinent family history.  Social History   Tobacco Use   Smoking status: Never   Smokeless tobacco: Never  Vaping Use   Vaping Use: Never used  Substance Use Topics   Alcohol use: Yes    Comment: wine occasional   Drug use: No    Home Medications Prior to Admission  medications   Medication Sig Start Date End Date Taking? Authorizing Provider  albuterol (PROVENTIL HFA;VENTOLIN HFA) 108 (90 Base) MCG/ACT inhaler Inhale 1-2 puffs into the lungs every 6 (six) hours as needed for wheezing or shortness of breath. 03/30/16   Rise Mu, PA-C  budesonide-formoterol (SYMBICORT) 80-4.5 MCG/ACT inhaler Inhale 2 puffs into the lungs 2 (two) times daily. 03/27/20   Ivonne Andrew, NP  montelukast (SINGULAIR) 10 MG tablet Take 1 tablet (10 mg total) by mouth at bedtime. 03/27/20   Ivonne Andrew, NP    Allergies    Contrast media [iodinated diagnostic agents], Oxycodone-acetaminophen, and Percocet [oxycodone-acetaminophen]  Review of Systems   Review of Systems  Constitutional: Negative.   HENT: Negative.    Eyes: Negative.   Respiratory:  Positive for shortness of breath and wheezing.   Cardiovascular: Negative.   Gastrointestinal: Negative.   Genitourinary: Negative.   Musculoskeletal: Negative.   Neurological: Negative.   Hematological: Negative.    Physical Exam Updated Vital Signs BP (!) 143/62 (BP Location: Left Arm)   Pulse 72   Temp 98.4 F (36.9 C) (Oral)   Resp 16   Ht 5\' 2"  (1.575 m)   Wt 116.1 kg   SpO2 99%   BMI 46.82 kg/m   Physical Exam Vitals and nursing note reviewed.  Constitutional:      General: She is not in  acute distress.    Appearance: She is not ill-appearing or toxic-appearing.  HENT:     Head: Normocephalic and atraumatic.     Nose: Congestion present.     Mouth/Throat:     Mouth: Mucous membranes are moist.     Pharynx: Oropharynx is clear. Uvula midline. Posterior oropharyngeal erythema present. No oropharyngeal exudate.     Tonsils: No tonsillar exudate.  Eyes:     General: Lids are normal. Vision grossly intact.        Right eye: No discharge.        Left eye: No discharge.     Conjunctiva/sclera: Conjunctivae normal.     Pupils: Pupils are equal, round, and reactive to light.  Neck:     Trachea:  Trachea and phonation normal.  Cardiovascular:     Rate and Rhythm: Normal rate and regular rhythm.     Pulses: Normal pulses.     Heart sounds: Normal heart sounds. No murmur heard. Pulmonary:     Effort: Pulmonary effort is normal. Prolonged expiration present. No tachypnea, bradypnea, accessory muscle usage, respiratory distress or retractions.     Breath sounds: Examination of the right-upper field reveals wheezing. Examination of the right-middle field reveals wheezing. Examination of the left-middle field reveals wheezing. Examination of the right-lower field reveals decreased breath sounds and wheezing. Examination of the left-lower field reveals decreased breath sounds and wheezing. Decreased breath sounds and wheezing present. No rales.  Chest:     Chest wall: No mass, lacerations, deformity, swelling, tenderness, crepitus or edema.  Abdominal:     General: Bowel sounds are normal. There is no distension.     Palpations: Abdomen is soft.     Tenderness: There is no abdominal tenderness. There is no right CVA tenderness, left CVA tenderness, guarding or rebound.  Musculoskeletal:        General: No deformity.     Cervical back: Normal range of motion and neck supple. No edema, rigidity or crepitus. No pain with movement, spinous process tenderness or muscular tenderness.     Right lower leg: No edema.     Left lower leg: No edema.  Lymphadenopathy:     Cervical: No cervical adenopathy.  Skin:    General: Skin is warm and dry.     Capillary Refill: Capillary refill takes less than 2 seconds.  Neurological:     General: No focal deficit present.     Mental Status: She is alert and oriented to person, place, and time. Mental status is at baseline.  Psychiatric:        Mood and Affect: Mood normal.    ED Results / Procedures / Treatments   Labs (all labs ordered are listed, but only abnormal results are displayed) Labs Reviewed - No data to display  EKG EKG: Sinus rhythm  without ischemic changes.   Radiology No results found.  Procedures Procedures   Medications Ordered in ED Medications  albuterol (VENTOLIN HFA) 108 (90 Base) MCG/ACT inhaler 2 puff (2 puffs Inhalation Given 05/06/21 0729)  ipratropium-albuterol (DUONEB) 0.5-2.5 (3) MG/3ML nebulizer solution 3 mL (3 mLs Nebulization Given 05/06/21 0817)  predniSONE (DELTASONE) tablet 60 mg (60 mg Oral Given 05/06/21 0816)    ED Course  I have reviewed the triage vital signs and the nursing notes.  Pertinent labs & imaging results that were available during my care of the patient were reviewed by me and considered in my medical decision making (see chart for details).    MDM Rules/Calculators/A&P  60 year old female presents with concern for likely asthma exacerbation.  Differential diagnosis includes but is not limited to asthma exacerbation, PE, pleural effusion, pneumothorax, pneumonia, ACS.  Hypertensive on intake, vital signs otherwise normal.  Cardiac exam is normal.  Pulmonary exam revealed wheezing at the lung fields bilaterally with diminished breath sounds in the bases.  No rales or rhonchi.  Mild posterior pharyngeal erythema without exudate or lymphadenopathy.  Abdominal exam is benign.  Patient is neurovascular intact in all 4 extremities and is overall very quite well-appearing.  HPI and physical exam most consistent with asthma exacerbation.  Very low clinical suspicion for ACS, PE, or infectious etiology.  Respiratory pathogen panel offered but patient declined as she tested negative for COVID-19 in the outpatient setting and is not concerned about influenza infection.  DuoNeb and oral steroid ordered as well as chest x-ray.  Patient signed out to oncoming ED provider Lorin Roemhildt, PA-C at time of shift change.  Plan at this time is to reevaluate the patient after her nebulizer treatment, check her ambulatory oxygen saturation, and discharge her home should she  remain well-appearing and her vital signs been normal.  Albuterol prescriptions and steroid course ordered for her in the outpatient setting.  Jazia voiced understanding of her medical evaluation and treatment plan thus far.  Each of her questions was answered to her expressed satisfaction.    This chart was dictated using voice recognition software, Dragon. Despite the best efforts of this provider to proofread and correct errors, errors may still occur which can change documentation meaning.  Final Clinical Impression(s) / ED Diagnoses Final diagnoses:  None    Rx / DC Orders ED Discharge Orders     None        Paris Lore, PA-C 05/06/21 0930    Gwyneth Sprout, MD 05/12/21 1621

## 2021-05-06 NOTE — Discharge Instructions (Addendum)
You are seen in the ER today for your asthma exacerbation.  You have been refilled your albuterol for at home and been prescribed 6 days of steroids to take to prevent recurrent severe exacerbation.    As we discussed I also sent off a COVID and flu test, and you can follow-up these results on MyChart.  Please follow-up with your primary care doctor and return to the ER with any new severe symptoms.

## 2021-05-06 NOTE — ED Provider Notes (Signed)
  Physical Exam  BP (!) 157/77   Pulse 75   Temp 98.4 F (36.9 C) (Oral)   Resp 17   Ht 5\' 2"  (1.575 m)   Wt 116.1 kg   SpO2 99%   BMI 46.82 kg/m   Physical Exam Vitals and nursing note reviewed.  Constitutional:      Appearance: Normal appearance.  HENT:     Head: Normocephalic and atraumatic.  Eyes:     Conjunctiva/sclera: Conjunctivae normal.  Cardiovascular:     Rate and Rhythm: Normal rate and regular rhythm.  Pulmonary:     Effort: Pulmonary effort is normal. No respiratory distress.     Breath sounds: Normal breath sounds.  Abdominal:     General: There is no distension.     Palpations: Abdomen is soft.     Tenderness: There is no abdominal tenderness.  Skin:    General: Skin is warm and dry.  Neurological:     General: No focal deficit present.     Mental Status: She is alert.    ED Course/Procedures     Procedures  MDM  Patient is 60 year old female with a history of asthma who presents to the emergency department with an asthma exacerbation. Patient received from [facility] PA-C at shift change. See her note for full HPI.  Plan to reevaluate after nebulizer.  10:43am On reevaluation patient has very mild expiratory wheezing in the bases, but with good air movement.  Patient overall states that she feels much better and is ready to go home.  She continues to have good oxygen saturation, both at rest and with ambulation.  Chest x-ray negative for any cardiopulmonary abnormalities.  Patient is a [facility] [facility], and has had contact with some children with COVID.  Added on a COVID and flu test, and patient feels comfortable following up these results on MyChart at home.  Plan to refill patient's albuterol inhaler and nebulizer, and give short course of steroids to prevent recurrent exacerbation.  Patient is overall hemodynamically stable and not requiring admission or inpatient treatment for her symptoms at this time.  She can discharge to home, and was given close  return precautions. Patient agreeable to plan.   Administrator, sports 05/06/21 1046    14/12/22, MD 05/12/21 1621

## 2021-05-12 ENCOUNTER — Encounter (HOSPITAL_COMMUNITY): Payer: Self-pay

## 2021-05-12 ENCOUNTER — Emergency Department (HOSPITAL_COMMUNITY)
Admission: EM | Admit: 2021-05-12 | Discharge: 2021-05-12 | Disposition: A | Discharge status: Home / Self Care | Payer: Self-pay | Attending: Emergency Medicine | Admitting: Emergency Medicine

## 2021-05-12 ENCOUNTER — Other Ambulatory Visit: Payer: Self-pay

## 2021-05-12 ENCOUNTER — Emergency Department (HOSPITAL_COMMUNITY): Payer: Self-pay

## 2021-05-12 DIAGNOSIS — J069 Acute upper respiratory infection, unspecified: Secondary | ICD-10-CM | POA: Insufficient documentation

## 2021-05-12 DIAGNOSIS — J4531 Mild persistent asthma with (acute) exacerbation: Secondary | ICD-10-CM | POA: Insufficient documentation

## 2021-05-12 DIAGNOSIS — Z8616 Personal history of COVID-19: Secondary | ICD-10-CM | POA: Insufficient documentation

## 2021-05-12 MED ORDER — PREDNISONE 10 MG (21) PO TBPK: ORAL_TABLET | Freq: Every day | ORAL | 0 refills | Status: DC

## 2021-05-12 MED ORDER — AEROCHAMBER PLUS FLO-VU MEDIUM MISC
1.0000 | Freq: Once | Status: DC
  Filled 2021-05-12: qty 1

## 2021-05-12 NOTE — ED Triage Notes (Signed)
Pt states that her asthma has been bad for the past week, seen at [facility] several days ago and put on antibiotics and steroids, not getting better. Asthma is not relieved by home breathing treatments.

## 2021-05-12 NOTE — ED Provider Notes (Signed)
[facility] EMERGENCY DEPARTMENT Provider Note   CSN: [id number] Arrival date & time: 05/12/21  7867     History Chief Complaint  Patient presents with   Asthma    Joan Fisher is a 60 y.o. female.  HPI 60 year old female history of obesity presents today complaining of ongoing cough and wheezing.  She states she became sick 9 days ago.  She had some increased cough and wheezing with this.  She was seen in the ED 1 week ago today.  At that time she was started on a prednisone Dosepak and had albuterol inhaler given.  She is continue to use her inhaler approximately every 6 hours.  She feels like she wheezes at that time and has some productive cough.  She was concerned that she might have some infection now.  She reports her initial chest x-Joan Fisher and COVID and flu test were negative she is on the last day of her steroid Dosepak.  She is continue to have some coughing and presented for recheck due to concern of infection.     Past Medical History:  Diagnosis Date   Asthma    Obesity     Patient Active Problem List   Diagnosis Date Noted   History of COVID-19 03/27/2020   Chest congestion 03/27/2020    Past Surgical History:  Procedure Laterality Date   dislocated shoulder repair     r   SHOULDER SURGERY       OB History   No obstetric history on file.     History reviewed. No pertinent family history.  Social History   Tobacco Use   Smoking status: Never   Smokeless tobacco: Never  Vaping Use   Vaping Use: Never used  Substance Use Topics   Alcohol use: Yes    Comment: wine occasional   Drug use: No    Home Medications Prior to Admission medications   Medication Sig Start Date End Date Taking? Authorizing Provider  albuterol (PROVENTIL) (2.5 MG/3ML) 0.083% nebulizer solution Take 3 mLs (2.5 mg total) by nebulization every 6 (six) hours as needed for wheezing or shortness of breath. 05/06/21   Sponseller, Lupe Carney R, PA-C  albuterol  (VENTOLIN HFA) 108 (90 Base) MCG/ACT inhaler Inhale 1-2 puffs into the lungs every 6 (six) hours as needed for wheezing or shortness of breath. 05/06/21   Sponseller, Eugene Gavia, PA-C  budesonide-formoterol (SYMBICORT) 80-4.5 MCG/ACT inhaler Inhale 2 puffs into the lungs 2 (two) times daily. 03/27/20   Ivonne Andrew, NP  montelukast (SINGULAIR) 10 MG tablet Take 1 tablet (10 mg total) by mouth at bedtime. 03/27/20   Ivonne Andrew, NP  predniSONE (STERAPRED UNI-PAK 21 TAB) 10 MG (21) TBPK tablet Take by mouth daily. Take 6 tabs by mouth daily  for 2 days, then 5 tabs for 2 days, then 4 tabs for 2 days, then 3 tabs for 2 days, 2 tabs for 2 days, then 1 tab by mouth daily for 2 days 05/12/21  Yes Margarita Grizzle, MD    Allergies    Contrast media [iodinated diagnostic agents], Oxycodone-acetaminophen, and Percocet [oxycodone-acetaminophen]  Review of Systems   Review of Systems  All other systems reviewed and are negative.  Physical Exam Updated Vital Signs BP (!) 165/95    Pulse 71    Temp 98 F (36.7 C) (Oral)    Resp 20    Ht 1.575 m (5\' 2" )    Wt 116.1 kg    SpO2 98%  BMI 46.82 kg/m   Physical Exam Vitals and nursing note reviewed.  Constitutional:      General: She is not in acute distress.    Appearance: She is well-developed.  HENT:     Head: Normocephalic and atraumatic.     Right Ear: External ear normal.     Left Ear: External ear normal.     Nose: Nose normal.  Eyes:     Conjunctiva/sclera: Conjunctivae normal.     Pupils: Pupils are equal, round, and reactive to light.  Cardiovascular:     Rate and Rhythm: Normal rate and regular rhythm.  Pulmonary:     Effort: Pulmonary effort is normal.     Breath sounds: No wheezing or rhonchi.  Abdominal:     General: Abdomen is flat. Bowel sounds are normal.     Palpations: Abdomen is soft.  Musculoskeletal:        General: Normal range of motion.     Cervical back: Normal range of motion and neck supple.  Skin:     General: Skin is warm and dry.  Neurological:     Mental Status: She is alert and oriented to person, place, and time.     Motor: No abnormal muscle tone.     Coordination: Coordination normal.  Psychiatric:        Behavior: Behavior normal.        Thought Content: Thought content normal.    ED Results / Procedures / Treatments   Labs (all labs ordered are listed, but only abnormal results are displayed) Labs Reviewed - No data to display  EKG None  Radiology DG Chest 2 View  Result Date: 05/12/2021 CLINICAL DATA:  60 year old female with history of cough. EXAM: CHEST - 2 VIEW COMPARISON:  Chest x-[name] 05/06/2021. FINDINGS: Lung volumes are normal. No consolidative airspace disease. No pleural effusions. No pneumothorax. No pulmonary nodule or mass noted. Pulmonary vasculature and the cardiomediastinal silhouette are within normal limits. Atherosclerosis in the thoracic aorta. Orthopedic fixation screw in the right scapula. IMPRESSION: 1.  No radiographic evidence of acute cardiopulmonary disease. 2. Aortic atherosclerosis. Electronically Signed   By: Trudie Reed M.D.   On: 05/12/2021 07:12    Procedures Procedures   Medications Ordered in ED Medications  AeroChamber Plus Flo-Vu Medium MISC 1 each (has no administration in time range)    ED Course  I have reviewed the triage vital signs and the nursing notes.  Pertinent labs & imaging results that were available during my care of the patient were reviewed by me and considered in my medical decision making (see chart for details).    MDM Rules/Calculators/A&P                          60 year old female with known history of asthma presents today with ongoing cough and symptoms consistent with viral URI.  Chest x-[name] is obtained and shows no evidence of acute abnormality.  On her exam patient is well-appearing and is not currently wheezing.  She is mildly hypertensive at 165/95 without known history.  She is moving air  well.  Plan to add spacer to her albuterol dosing.  I have ordered a another Medrol Dosepak.  She will restart if she is feeling worse after completing her current Medrol Dosepak today.  She appears to be very stable and her oxygen saturations are normal.  She has not felt dyspneic.  She presented simply because of the ongoing cough.  We  discussed her increased use of her albuterol and that this could be appropriate to use up to every 4 hours.  At baseline she does not need to use albuterol.  We have discussed return precautions and need for follow-up and she voices understanding.  She is given a work note until Wednesday.  She is not sure she feels like she will need to use this.  She should be outside of the window of being contagious and wear a mask at work.  Final Clinical Impression(s) / ED Diagnoses Final diagnoses:  Upper respiratory tract infection, unspecified type  Mild persistent asthmatic bronchitis with acute exacerbation    Rx / DC Orders ED Discharge Orders          Ordered    predniSONE (STERAPRED UNI-PAK 21 TAB) 10 MG (21) TBPK tablet  Daily        05/12/21 0755             Margarita Grizzle, MD 05/12/21 0800

## 2021-07-21 ENCOUNTER — Emergency Department (HOSPITAL_COMMUNITY)
Admission: EM | Admit: 2021-07-21 | Discharge: 2021-07-21 | Disposition: A | Discharge status: Home / Self Care | Payer: Self-pay | Attending: Emergency Medicine | Admitting: Emergency Medicine

## 2021-07-21 ENCOUNTER — Encounter (HOSPITAL_COMMUNITY): Payer: Self-pay | Admitting: Emergency Medicine

## 2021-07-21 ENCOUNTER — Other Ambulatory Visit: Payer: Self-pay

## 2021-07-21 DIAGNOSIS — Y990 Civilian activity done for income or pay: Secondary | ICD-10-CM | POA: Insufficient documentation

## 2021-07-21 DIAGNOSIS — M62838 Other muscle spasm: Secondary | ICD-10-CM

## 2021-07-21 DIAGNOSIS — M62830 Muscle spasm of back: Secondary | ICD-10-CM | POA: Insufficient documentation

## 2021-07-21 DIAGNOSIS — X500XXA Overexertion from strenuous movement or load, initial encounter: Secondary | ICD-10-CM | POA: Insufficient documentation

## 2021-07-21 DIAGNOSIS — G43809 Other migraine, not intractable, without status migrainosus: Secondary | ICD-10-CM | POA: Insufficient documentation

## 2021-07-21 LAB — CBC WITH DIFFERENTIAL/PLATELET
Abs Immature Granulocytes: 0.02 10*3/uL (ref 0.00–0.07)
Basophils Absolute: 0 10*3/uL (ref 0.0–0.1)
Basophils Relative: 1 %
Eosinophils Absolute: 0 10*3/uL (ref 0.0–0.5)
Eosinophils Relative: 0 %
HCT: 38.1 % (ref 36.0–46.0)
Hemoglobin: 12.3 g/dL (ref 12.0–15.0)
Immature Granulocytes: 0 %
Lymphocytes Relative: 15 %
Lymphs Abs: 1.1 10*3/uL (ref 0.7–4.0)
MCH: 29.2 pg (ref 26.0–34.0)
MCHC: 32.3 g/dL (ref 30.0–36.0)
MCV: 90.5 fL (ref 80.0–100.0)
Monocytes Absolute: 0.4 10*3/uL (ref 0.1–1.0)
Monocytes Relative: 5 %
Neutro Abs: 5.9 10*3/uL (ref 1.7–7.7)
Neutrophils Relative %: 79 %
Platelets: 239 10*3/uL (ref 150–400)
RBC: 4.21 MIL/uL (ref 3.87–5.11)
RDW: 13.2 % (ref 11.5–15.5)
WBC: 7.4 10*3/uL (ref 4.0–10.5)
nRBC: 0 % (ref 0.0–0.2)

## 2021-07-21 LAB — COMPREHENSIVE METABOLIC PANEL
ALT: 14 U/L (ref 0–44)
AST: 18 U/L (ref 15–41)
Albumin: 4.1 g/dL (ref 3.5–5.0)
Alkaline Phosphatase: 54 U/L (ref 38–126)
Anion gap: 5 (ref 5–15)
BUN: 9 mg/dL (ref 6–20)
CO2: 26 mmol/L (ref 22–32)
Calcium: 9.3 mg/dL (ref 8.9–10.3)
Chloride: 104 mmol/L (ref 98–111)
Creatinine, Ser: 0.67 mg/dL (ref 0.44–1.00)
GFR, Estimated: >60 mL/min (ref >60)
Glucose, Bld: 118 mg/dL — ABNORMAL HIGH (ref 70–99)
Potassium: 3.8 mmol/L (ref 3.5–5.1)
Sodium: 135 mmol/L (ref 135–145)
Total Bilirubin: 0.5 mg/dL (ref 0.3–1.2)
Total Protein: 7.8 g/dL (ref 6.5–8.1)

## 2021-07-21 MED ORDER — METHYLPREDNISOLONE 4 MG PO TBPK: ORAL_TABLET | ORAL | 0 refills | Status: DC

## 2021-07-21 MED ORDER — CYCLOBENZAPRINE HCL 10 MG PO TABS: 10.0000 mg | ORAL_TABLET | Freq: Two times a day (BID) | ORAL | 0 refills | Status: AC | PRN

## 2021-07-21 MED ORDER — PROCHLORPERAZINE EDISYLATE 10 MG/2ML IJ SOLN
10.0000 mg | Freq: Once | INTRAMUSCULAR | Status: AC
  Administered 2021-07-21: 10 mg via INTRAVENOUS
  Filled 2021-07-21: qty 2

## 2021-07-21 MED ORDER — SODIUM CHLORIDE 0.9 % IV BOLUS
1000.0000 mL | Freq: Once | INTRAVENOUS | Status: AC
  Administered 2021-07-21: 1000 mL via INTRAVENOUS

## 2021-07-21 MED ORDER — DIPHENHYDRAMINE HCL 50 MG/ML IJ SOLN
12.5000 mg | Freq: Once | INTRAMUSCULAR | Status: AC
Start: 2021-07-21 — End: 2021-07-21
  Administered 2021-07-21: 12.5 mg via INTRAVENOUS
  Filled 2021-07-21: qty 1

## 2021-07-21 NOTE — ED Provider Notes (Signed)
[place] DEPT Provider Note   CSN: [id number] Arrival date & time: 07/21/21  [id number]     History  Chief Complaint  Patient presents with   Back Pain   Headache    Joan Fisher is a 61 y.o. female.  Patient is here with headache, upper back pain.  Patient with symptoms for the last several days.  Muscle tension in her upper back, frontal headache.  Some nausea.  Has been doing some heavy lifting with work.  Denies any numbness or tingling.  Denies any cough or sputum production.  No specific chest pain.  Denies any speech changes or vision changes.       Home Medications Prior to Admission medications   Medication Sig Start Date End Date Taking? Authorizing Provider  cyclobenzaprine (FLEXERIL) 10 MG tablet Take 1 tablet (10 mg total) by mouth 2 (two) times daily as needed for muscle spasms. 07/21/21  Yes Marylouise Mallet, DO  methylPREDNISolone (MEDROL DOSEPAK) 4 MG TBPK tablet Follow package insert 07/21/21  Yes Cynthya Yam, DO  albuterol (PROVENTIL) (2.5 MG/3ML) 0.083% nebulizer solution Take 3 mLs (2.5 mg total) by nebulization every 6 (six) hours as needed for wheezing or shortness of breath. 05/06/21   Sponseller, Eugene Garnet R, PA-C  albuterol (VENTOLIN HFA) 108 (90 Base) MCG/ACT inhaler Inhale 1-2 puffs into the lungs every 6 (six) hours as needed for wheezing or shortness of breath. 05/06/21   Sponseller, Gypsy Balsam, PA-C  budesonide-formoterol (SYMBICORT) 80-4.5 MCG/ACT inhaler Inhale 2 puffs into the lungs 2 (two) times daily. 03/27/20   Fenton Foy, NP  montelukast (SINGULAIR) 10 MG tablet Take 1 tablet (10 mg total) by mouth at bedtime. 03/27/20   Fenton Foy, NP      Allergies    Contrast media [iodinated contrast media], Oxycodone-acetaminophen, and Percocet [oxycodone-acetaminophen]    Review of Systems   Review of Systems  Physical Exam Updated Vital Signs BP (!) 174/98    Pulse 67    Temp 98.3 F (36.8 C) (Oral)    Resp 13     Ht 5\' 2"  (1.575 m)    Wt 116.1 kg    SpO2 100%    BMI 46.82 kg/m  Physical Exam Vitals and nursing note reviewed.  Constitutional:      General: She is not in acute distress.    Appearance: She is well-developed. She is not ill-appearing.  HENT:     Head: Normocephalic and atraumatic.  Eyes:     Extraocular Movements: Extraocular movements intact.     Conjunctiva/sclera: Conjunctivae normal.     Pupils: Pupils are equal, round, and reactive to light. Pupils are equal.  Cardiovascular:     Rate and Rhythm: Normal rate and regular rhythm.     Heart sounds: Normal heart sounds. No murmur heard. Pulmonary:     Effort: Pulmonary effort is normal. No respiratory distress.     Breath sounds: Normal breath sounds.  Abdominal:     Palpations: Abdomen is soft.     Tenderness: There is no abdominal tenderness.  Musculoskeletal:        General: No swelling.     Cervical back: Normal range of motion and neck supple.     Comments: Tenderness to paraspinal thoracic muscles bilaterally going into the trap muscles as well as the lower neck muscles  Skin:    General: Skin is warm and dry.     Capillary Refill: Capillary refill takes less than 2 seconds.  Neurological:  Mental Status: She is alert and oriented to person, place, and time.     Cranial Nerves: No cranial nerve deficit or dysarthria.     Sensory: No sensory deficit.     Motor: No weakness.     Coordination: Coordination normal.     Comments: 5+ out of 5 strength throughout, normal sensation, no drift, normal finger-nose-finger, normal speech  Psychiatric:        Mood and Affect: Mood normal.    ED Results / Procedures / Treatments   Labs (all labs ordered are listed, but only abnormal results are displayed) Labs Reviewed  COMPREHENSIVE METABOLIC PANEL - Abnormal; Notable for the following components:      Result Value   Glucose, Bld 118 (*)    All other components within normal limits  CBC WITH DIFFERENTIAL/PLATELET     EKG EKG Interpretation  Date/Time:  Sunday July 21 2021 09:40:40 EST Ventricular Rate:  70 PR Interval:  183 QRS Duration: 86 QT Interval:  393 QTC Calculation: 424 R Axis:   44 Text Interpretation: Sinus rhythm Abnormal R-wave progression, early transition Confirmed by Ronnald Nian, Tateanna Bach ([id number]) on 07/21/2021 9:53:59 AM  Radiology No results found.  Procedures Procedures    Medications Ordered in ED Medications  sodium chloride 0.9 % bolus 1,000 mL (1,000 mLs Intravenous New Bag/Given 07/21/21 0955)  prochlorperazine (COMPAZINE) injection 10 mg (10 mg Intravenous Given 07/21/21 0955)  diphenhydrAMINE (BENADRYL) injection 12.5 mg (12.5 mg Intravenous Given 07/21/21 [id number])    ED Course/ Medical Decision Making/ A&P                           Medical Decision Making Amount and/or Complexity of Data Reviewed Labs: ordered.  Risk Prescription drug management.   Joan Fisher is here with headache and back pain.  Unremarkable vitals.  No fever.  Symptoms for the last several days.  Associated with lifting and movement.  Denies any active chest pain, shortness of breath, cough, fever, sputum production.  Neurologically she is intact.  She has no numbness or weakness.  She is tender in the paraspinal upper thoracic spine and lower cervical spine including her trapezius muscles bilaterally.  She has good pulses in her upper extremities.  Overall this seems to be a muscular process.  I have a lower suspicion for stroke, acute coronary syndrome, infectious process.  Will check CBC, BMP.  Will give headache cocktail of Benadryl and Compazine and fluid bolus.  Headaches have been making her feel nauseous.  Overall suspect migraine with muscle tension as source.  Anticipate discharge with steroids which she has had with improvement in the past.  Lab work was reviewed and interpreted.  There is no significant anemia, electrolyte Abdelhai, kidney injury.  Patient feeling much better and  headache is fully resolved.  Will prescribe muscle relaxant Flexeril as well as Medrol Dosepak.  Overall suspect tension headache, muscle spasm.  We will have her follow-up with primary care doctor.  Discharged in good condition.  This chart was dictated using voice recognition software.  Despite best efforts to proofread,  errors can occur which can change the documentation meaning.         Final Clinical Impression(s) / ED Diagnoses Final diagnoses:  Other migraine without status migrainosus, not intractable  Muscle spasm    Rx / DC Orders ED Discharge Orders          Ordered    methylPREDNISolone (MEDROL DOSEPAK) 4 MG TBPK tablet  07/21/21 1029    cyclobenzaprine (FLEXERIL) 10 MG tablet  2 times daily PRN        07/21/21 1029              Esequiel Kleinfelter, Quita Skye, DO 07/21/21 1030

## 2021-07-21 NOTE — ED Triage Notes (Signed)
BIBA Per EMS: Pt coming from home w/ c/o middle, right back pain that radiates to chest X 3 days. 12 lead normal. Pt also c/o headache and dizziness, N/v x 3 days. Hypertensive with EMS.  192/90 BP 100% RA 66 HR  111 CBG  Hx asthma

## 2021-07-21 NOTE — Discharge Instructions (Signed)
Continue 1000 mg of Tylenol every 6 hours as needed for pain.  Consider taking 400 mg ibuprofen every 8 hours as needed for pain.  I have prescribed you muscle relaxant to help with your discomfort as well.  Do not use while driving or doing dangerous activities as this medication is sedating.  Take steroid Dosepak as prescribed.

## 2021-08-22 IMAGING — DX DG CHEST 1V PORT
1 series · 1 of 1 positions shown · non-contrast
Comparison: Radiograph 10/31/2017

CLINICAL DATA: A17QH-KU positive, shortness of breath

EXAM:
PORTABLE CHEST 1 VIEW

[chest ap]
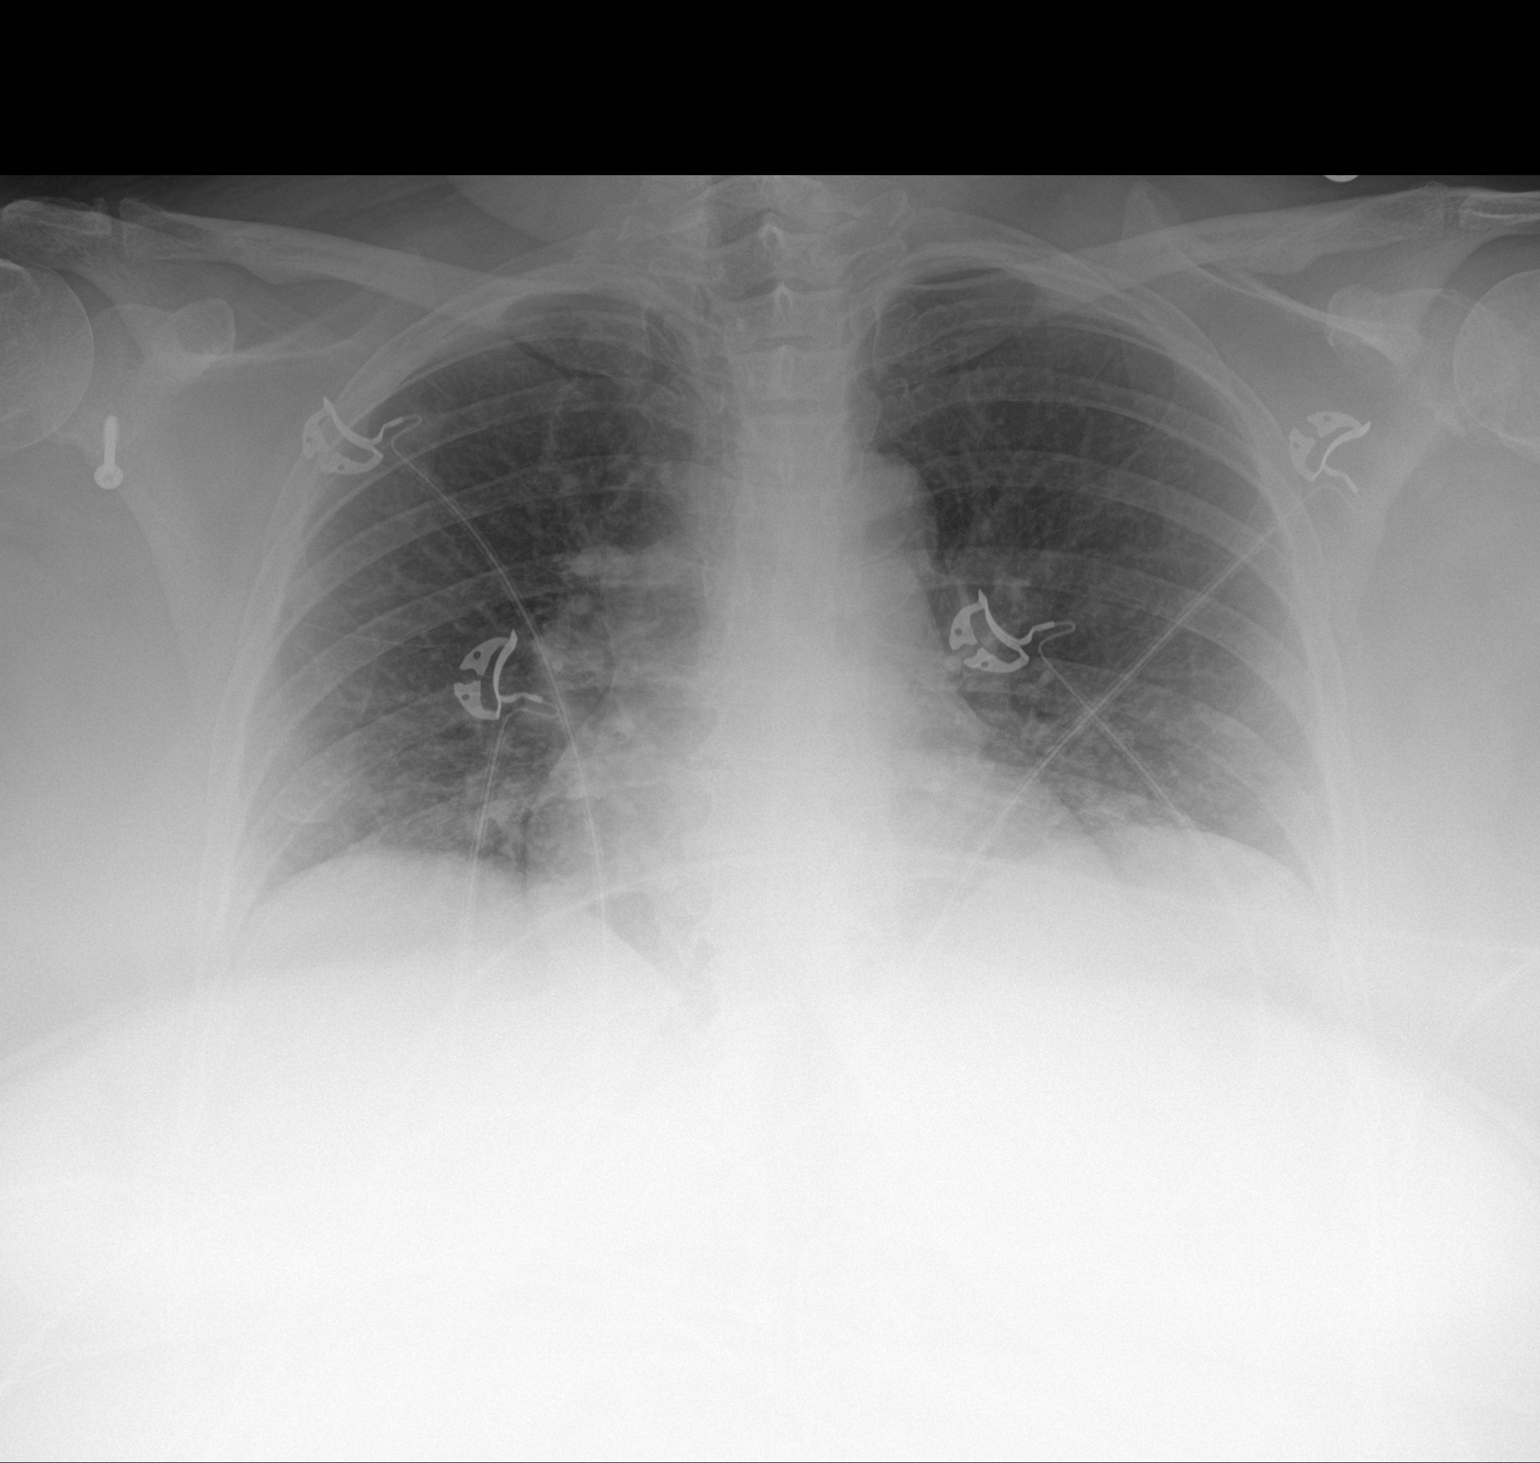

[1 of 1 positions shown; findings below may reference images not displayed]

FINDINGS: Low volumes and atelectasis. Some hazy and patchy opacities present
towards the medial lung bases may reflect vascular
crowding/atelectasis or some early airspace disease. No pneumothorax
or visible effusion. Cardiomediastinal contours are unremarkable for
portable technique. Postsurgical changes of the right glenoid may
reflect prior Latarjet procedure. No acute osseous or soft tissue
abnormality. Telemetry leads overlie the chest.
IMPRESSION: Low volumes and atelectasis.

Some hazy and patchy opacities present towards the medial lung bases
may reflect vascular crowding/atelectasis or some early airspace
disease in the setting of [id number]-KU.

## 2021-10-09 ENCOUNTER — Emergency Department (HOSPITAL_COMMUNITY): Admission: EM | Admit: 2021-10-09 | Discharge: 2021-10-10 | Discharge status: Against Medical Advice | Payer: Self-pay

## 2021-10-27 IMAGING — DX DG CHEST 1V PORT
1 series · 1 of 1 positions shown · non-contrast
Comparison: 04/02/2020 and prior.

CLINICAL DATA: SOB, cough

EXAM:
PORTABLE CHEST 1 VIEW

[chest ap]
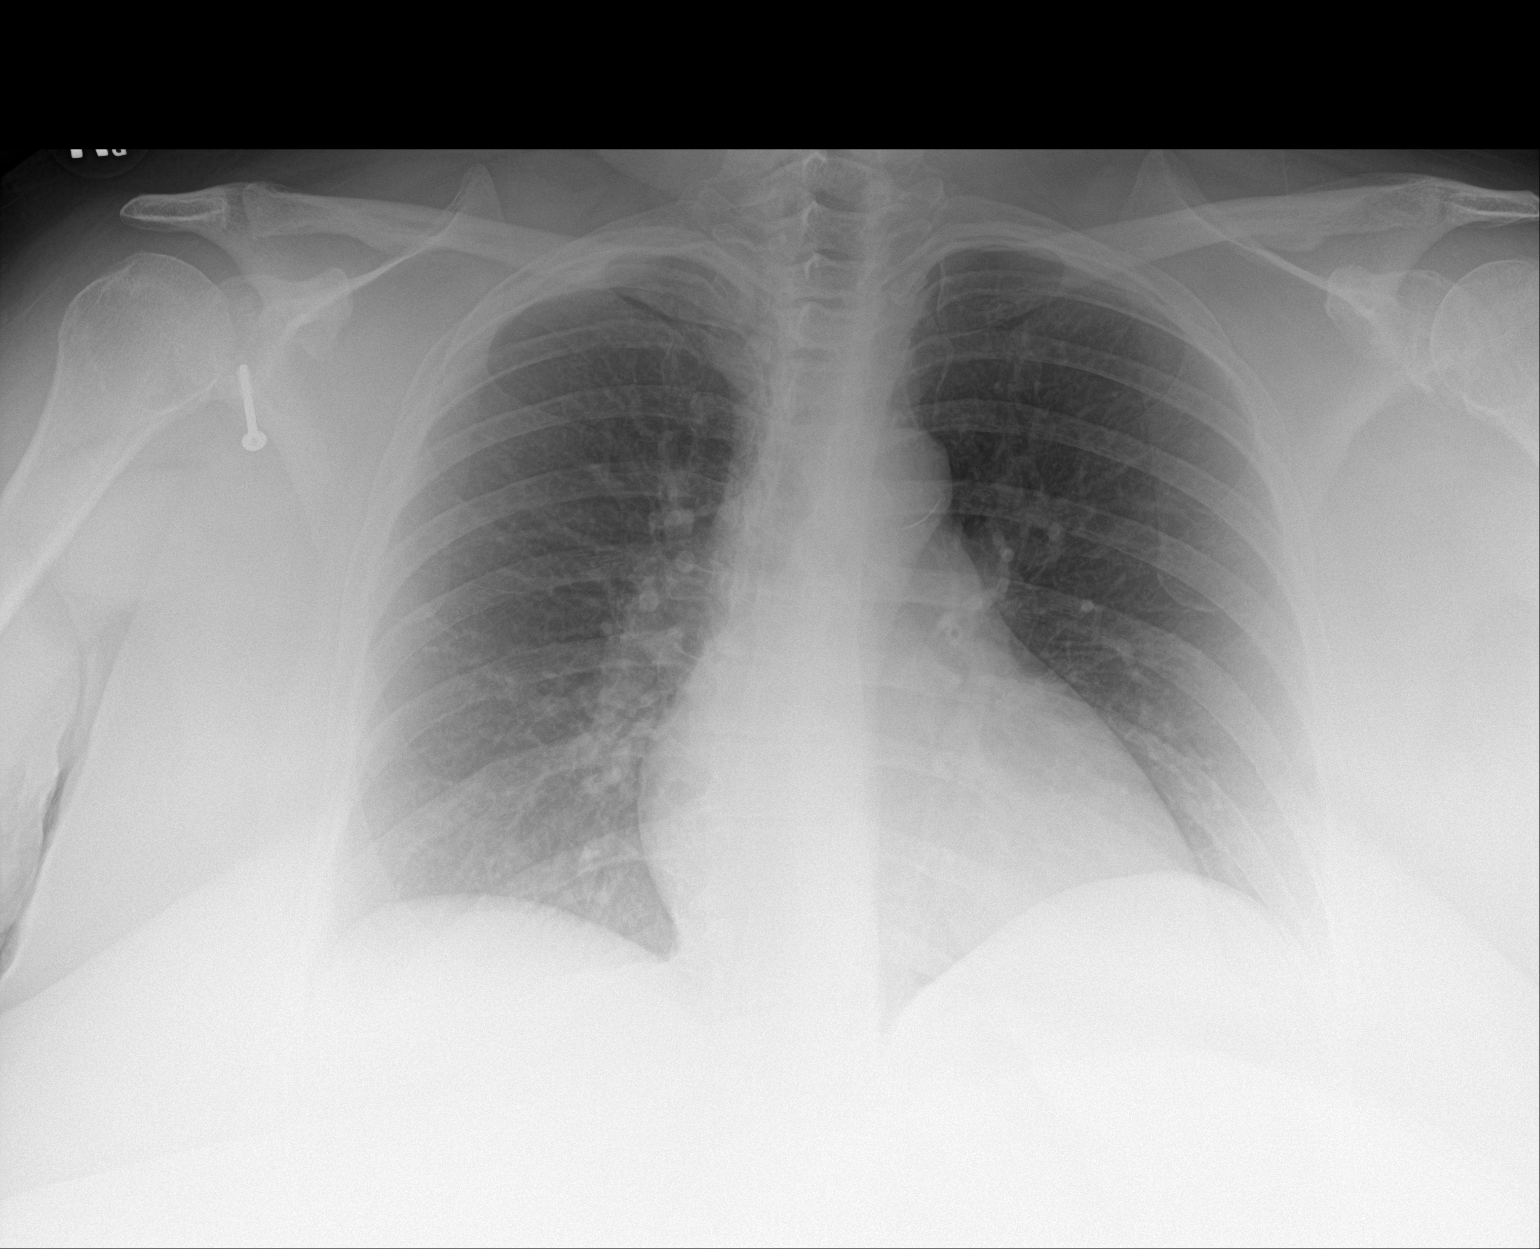

[1 of 1 positions shown; findings below may reference images not displayed]

FINDINGS: Mild hypoinflation. No pneumothorax or pleural effusion. No focal
consolidation. Cardiomediastinal silhouette within normal limits.
Right shoulder postsurgical sequela.
IMPRESSION: No focal airspace disease.

## 2021-11-05 ENCOUNTER — Encounter (HOSPITAL_COMMUNITY): Payer: Self-pay | Admitting: Emergency Medicine

## 2021-11-05 ENCOUNTER — Emergency Department (HOSPITAL_COMMUNITY)
Admission: EM | Admit: 2021-11-05 | Discharge: 2021-11-05 | Disposition: A | Discharge status: Home / Self Care | Payer: Self-pay | Attending: Emergency Medicine | Admitting: Emergency Medicine

## 2021-11-05 ENCOUNTER — Other Ambulatory Visit: Payer: Self-pay

## 2021-11-05 DIAGNOSIS — Z7951 Long term (current) use of inhaled steroids: Secondary | ICD-10-CM | POA: Insufficient documentation

## 2021-11-05 DIAGNOSIS — J45909 Unspecified asthma, uncomplicated: Secondary | ICD-10-CM | POA: Insufficient documentation

## 2021-11-05 DIAGNOSIS — K0889 Other specified disorders of teeth and supporting structures: Secondary | ICD-10-CM

## 2021-11-05 DIAGNOSIS — K029 Dental caries, unspecified: Secondary | ICD-10-CM | POA: Insufficient documentation

## 2021-11-05 MED ORDER — AMOXICILLIN-POT CLAVULANATE 875-125 MG PO TABS: 1.0000 | ORAL_TABLET | Freq: Two times a day (BID) | ORAL | 0 refills | Status: DC

## 2021-11-05 MED ORDER — KETOROLAC TROMETHAMINE 60 MG/2ML IM SOLN: 15.0000 mg | Freq: Once | INTRAMUSCULAR | Status: DC

## 2021-11-05 NOTE — ED Triage Notes (Signed)
Pt reports dental pain on upper and lower right side x 3 days, reports taking Motrin and Tylenol with little relief, has not seen dentist as she has no dental ins

## 2021-11-05 NOTE — ED Provider Notes (Signed)
[facility]-EMERGENCY DEPT Provider Note   CSN: [id number] Arrival date & time: 11/05/21  0533     History Chief Complaint  Patient presents with   Dental Pain    Joan Fisher is a 61 y.o. female with history of asthma presents to the emergency department for evaluation of right-sided dental pain for the past 3 days.  Patient reports that she does not over the last time she followed up with a dentist.  She reports she just recently started a new job and does not have dental insurance currently.  She has been trying some Tylenol for arthritis for pain with minimal relief.  Denies any trouble swallowing.  She is still able to eat and drink, she just has to chew on her left side.  Denies any chest pain or any shortness of breath.  No fevers.  Reports she is allergic to oxycodone and contrast media.   Dental Pain Associated symptoms: no congestion, no drooling and no fever        Home Medications Prior to Admission medications   Medication Sig Start Date End Date Taking? Authorizing Provider  albuterol (PROVENTIL) (2.5 MG/3ML) 0.083% nebulizer solution Take 3 mLs (2.5 mg total) by nebulization every 6 (six) hours as needed for wheezing or shortness of breath. 05/06/21   Sponseller, Lupe Carney R, PA-C  albuterol (VENTOLIN HFA) 108 (90 Base) MCG/ACT inhaler Inhale 1-2 puffs into the lungs every 6 (six) hours as needed for wheezing or shortness of breath. 05/06/21   Sponseller, Eugene Gavia, PA-C  budesonide-formoterol (SYMBICORT) 80-4.5 MCG/ACT inhaler Inhale 2 puffs into the lungs 2 (two) times daily. 03/27/20   Ivonne Andrew, NP  cyclobenzaprine (FLEXERIL) 10 MG tablet Take 1 tablet (10 mg total) by mouth 2 (two) times daily as needed for muscle spasms. 07/21/21   Curatolo, Adam, DO  methylPREDNISolone (MEDROL DOSEPAK) 4 MG TBPK tablet Follow package insert 07/21/21   Curatolo, Adam, DO  montelukast (SINGULAIR) 10 MG tablet Take 1 tablet (10 mg total) by mouth at bedtime.  03/27/20   Ivonne Andrew, NP      Allergies    Contrast media [iodinated contrast media], Oxycodone-acetaminophen, and Percocet [oxycodone-acetaminophen]    Review of Systems   Review of Systems  Constitutional:  Negative for chills and fever.  HENT:  Positive for dental problem. Negative for congestion, drooling, rhinorrhea, sore throat and trouble swallowing.   Respiratory:  Negative for shortness of breath.   Cardiovascular:  Negative for chest pain.    Physical Exam Updated Vital Signs BP (!) 184/87 (BP Location: Left Arm)   Pulse 60   Temp 98.7 F (37.1 C) (Oral)   Resp 19   Ht 5\' 2"  (1.575 m)   Wt 116.1 kg   SpO2 100%   BMI 46.82 kg/m  Physical Exam Vitals and nursing note reviewed.  Constitutional:      General: She is not in acute distress.    Appearance: Normal appearance. She is not toxic-appearing.  HENT:     Mouth/Throat:     Mouth: Mucous membranes are moist.     Comments: Patient has multiple dental caries and missing teeth.  She has caries riding infection into the gumline.  No palpable or visualized induration or fluctuance.  No surrounding erythema.  No trismus.  The patient is able to open her mouth greater than 3 fingerbreadths wide.  Moist mucous membranes.  Uvula midline.  Airway patent.  Patient is speaking in full sentences with ease.  Patient controlling  secretions. Eyes:     General: No scleral icterus. Pulmonary:     Effort: Pulmonary effort is normal. No respiratory distress.  Musculoskeletal:     Cervical back: Normal range of motion.  Lymphadenopathy:     Cervical: No cervical adenopathy.  Skin:    General: Skin is dry.     Findings: No rash.  Neurological:     General: No focal deficit present.     Mental Status: She is alert. Mental status is at baseline.  Psychiatric:        Mood and Affect: Mood normal.     ED Results / Procedures / Treatments   Labs (all labs ordered are listed, but only abnormal results are displayed) Labs  Reviewed - No data to display  EKG None  Radiology No results found.  Procedures Procedures   Medications Ordered in ED Medications - No data to display  ED Course/ Medical Decision Making/ A&P                           Medical Decision Making  61 year old female presents emerged department for evaluation of right-sided dental pain.  Differential diagnosis includes was not much to trismus, dental infection, deep space infection, dental caries, poor dentition.  Vital signs show slightly elevated blood pressure 159/80 otherwise afebrile, normal pulse rate, satting well room air without increased work of breathing.  Physical exam is pertinent for patient has multiple dental caries and missing teeth.  She has caries riding infection into the gumline.  No palpable or visualized induration or fluctuance.  No surrounding erythema.  No trismus.  The patient is able to open her mouth greater than 3 fingerbreadths wide.  Moist mucous membranes.  Uvula midline.  Airway patent.  Patient is speaking in full sentences with ease.  Patient controlling secretions.  Patient's physical exam is reassuring.  We will discharge her home on antibiotics and dental resources given.  I recommended the patient's start calling dentist on the list so that she can be evaluated.  We reported single bite with her medications and adhering to the prescription.  Also discussed with her to take 600 mg of ibuprofen and 1000 mg of Tylenol as needed for pain.  We discussed red flag symptoms and strict return precautions.  Patient verbalized understanding and agrees to plan.  Patient is stable and being discharged home in good condition.  Final Clinical Impression(s) / ED Diagnoses Final diagnoses:  Dental caries  Pain, dental    Rx / DC Orders ED Discharge Orders     None         Achille Rich, PA-C 11/05/21 2542    Gloris Manchester, MD 11/06/21 [phone]

## 2021-11-05 NOTE — Discharge Instructions (Addendum)
You were seen in the emergency department for evaluation of your dental pain.  I have started you on an antibiotic called Augmentin that you will take twice daily for the next 14 days.  Additionally, I have included some dental resources to this discharge paperwork.  Please make sure you are calling the dentist to schedule an appointment.  You can take 600 mg of ibuprofen and 1000 mg of Tylenol every 6 hours as needed for pain.  You can try soft foods to help with the pain as well.  If you have any concern, new or worsening symptoms, please return to the nearest emergency department for evaluation.  Contact a dental care provider if: You have any unexplained dental pain. Your pain is not controlled with medicines. Your symptoms get worse. You have new symptoms. Get help right away if: You are unable to open your mouth. You are having trouble breathing or swallowing. You have a fever. You notice that your face, neck, or jaw is swollen. These symptoms may represent a serious problem that is an emergency. Do not wait to see if the symptoms will go away. Get medical help right away. Call your local emergency services (911 in the [place] Do not drive yourself to the hospital.

## 2022-08-17 ENCOUNTER — Other Ambulatory Visit: Payer: Self-pay

## 2022-08-17 ENCOUNTER — Emergency Department (HOSPITAL_COMMUNITY)
Admission: EM | Admit: 2022-08-17 | Discharge: 2022-08-17 | Disposition: A | Discharge status: Home / Self Care | Payer: Self-pay | Attending: Emergency Medicine | Admitting: Emergency Medicine

## 2022-08-17 ENCOUNTER — Encounter (HOSPITAL_COMMUNITY): Payer: Self-pay

## 2022-08-17 DIAGNOSIS — J45909 Unspecified asthma, uncomplicated: Secondary | ICD-10-CM | POA: Insufficient documentation

## 2022-08-17 DIAGNOSIS — Z8616 Personal history of COVID-19: Secondary | ICD-10-CM | POA: Insufficient documentation

## 2022-08-17 DIAGNOSIS — M545 Low back pain, unspecified: Secondary | ICD-10-CM | POA: Insufficient documentation

## 2022-08-17 NOTE — ED Provider Notes (Signed)
[place] EMERGENCY DEPARTMENT AT [facility] Provider Note  CSN: [id number] Arrival date & time: 08/17/22 [id number]  Chief Complaint(s) Hip Pain  HPI Joan Fisher is a 62 y.o. female    The history is provided by the patient.  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 She admits to bending at the waist and not squatting to lift.   Past Medical History Past Medical History:  Diagnosis Date   Asthma    Obesity    Patient Active Problem List   Diagnosis Date Noted   History of COVID-19 03/27/2020   Chest congestion 03/27/2020   Home Medication(s) Prior to Admission medications   Medication Sig Start Date End Date Taking? Authorizing Provider  albuterol (PROVENTIL) (2.5 MG/3ML) 0.083% nebulizer solution Take 3 mLs (2.5 mg total) by nebulization every 6 (six) hours as needed for wheezing or shortness of breath. 05/06/21   Sponseller, Eugene Garnet R, PA-C  albuterol (VENTOLIN HFA) 108 (90 Base) MCG/ACT inhaler Inhale 1-2 puffs into the lungs every 6 (six) hours as needed for wheezing or shortness of breath. 05/06/21   Sponseller, Eugene Garnet R, PA-C  amoxicillin-clavulanate (AUGMENTIN) 875-125 MG tablet Take 1 tablet by mouth every 12 (twelve) hours. 11/05/21   Sherrell Puller, PA-C  budesonide-formoterol (SYMBICORT) 80-4.5 MCG/ACT inhaler Inhale 2 puffs into the lungs 2 (two) times daily. 03/27/20   Fenton Foy, NP  cyclobenzaprine (FLEXERIL) 10 MG tablet Take 1 tablet (10 mg total) by mouth 2 (two)  times daily as needed for muscle spasms. 07/21/21   Curatolo, Adam, DO  methylPREDNISolone (MEDROL DOSEPAK) 4 MG TBPK tablet Follow package insert 07/21/21   Curatolo, Adam, DO  montelukast (SINGULAIR) 10 MG tablet Take 1 tablet (10 mg total) by mouth at bedtime. 03/27/20   Fenton Foy, NP                                                                                                                                    Allergies Contrast media [iodinated contrast media], Oxycodone-acetaminophen, and Percocet [oxycodone-acetaminophen]  Review of Systems Review of Systems  Genitourinary:  Negative for bladder incontinence.  Musculoskeletal:  Positive for back pain.  Neurological:  Negative for numbness.   As noted in HPI  Physical Exam Vital Signs  I have reviewed the triage vital signs BP (!) 145/87 (BP Location: Left Arm)   Pulse 71   Temp 98 F (36.7 C) (Oral)   Resp 20  Ht 5\' 2"  (1.575 m)   Wt 113.4 kg   SpO2 100%   BMI 45.73 kg/m   Physical Exam Vitals reviewed.  Constitutional:      General: She is not in acute distress.    Appearance: She is well-developed. She is not diaphoretic.  HENT:     Head: Normocephalic and atraumatic.     Right Ear: External ear normal.     Left Ear: External ear normal.     Nose: Nose normal.  Eyes:     General: No scleral icterus.    Conjunctiva/sclera: Conjunctivae normal.  Neck:     Trachea: Phonation normal.  Cardiovascular:     Rate and Rhythm: Normal rate and regular rhythm.  Pulmonary:     Effort: Pulmonary effort is normal. No respiratory distress.     Breath sounds: No stridor.  Abdominal:     General: There is no distension.  Musculoskeletal:        General: Normal range of motion.     Cervical back: Normal range of motion.     Lumbar back: Spasms and tenderness present. No bony tenderness.       Back:  Neurological:     Mental Status: She is alert and oriented to person, place, and time.     Sensory: Sensation is  intact.     Motor: Motor function is intact.     Gait: Gait is intact.  Psychiatric:        Behavior: Behavior normal.     ED Results and Treatments Labs (all labs ordered are listed, but only abnormal results are displayed) Labs Reviewed - No data to display                                                                                                                       EKG  EKG Interpretation  Date/Time:    Ventricular Rate:    PR Interval:    QRS Duration:   QT Interval:    QTC Calculation:   R Axis:     Text Interpretation:         Radiology No results found.  Medications Ordered in ED Medications - No data to display                                                                                                                                   Procedures Procedures  (including critical  care time)  Medical Decision Making / ED Course  Click here for ABCD2, HEART and other calculators  Medical Decision Making   63 y.o. female presents with back pain in lumbar area for 3 days without signs of radicular pain. No acute traumatic onset. No red flag symptoms of fever, weight loss, saddle anesthesia, weakness, fecal/urinary incontinence or urinary retention.   Suspect MSK etiology. No indication for imaging emergently. Patient was recommended to take short course of scheduled NSAIDs and engage in early mobility as definitive treatment. Return precautions discussed for worsening or new concerning symptoms.       Final Clinical Impression(s) / ED Diagnoses Final diagnoses:  Acute right-sided low back pain without sciatica   The patient appears reasonably screened and/or stabilized for discharge and I doubt any other medical condition or other [facility] requiring further screening, evaluation, or treatment in the ED at this time. I have discussed the findings, Dx and Tx plan with the patient/family who expressed understanding and agree(s) with the plan. Discharge  instructions discussed at length. The patient/family was given strict return precautions who verbalized understanding of the instructions. No further questions at time of discharge.  Disposition: Discharge  Condition: Good  ED Discharge Orders     None        Follow Up: Primary care provider  Call  to schedule an appointment for close follow up           This chart was dictated using voice recognition software.  Despite best efforts to proofread,  errors can occur which can change the documentation meaning.    Fatima Blank, MD 08/17/22 [phone]

## 2022-08-17 NOTE — ED Triage Notes (Signed)
Patient reports right hip/buttock pain when ambulating. States this started a couple days ago but denies any injury to cause pain. States pain is not present when sitting.

## 2022-08-17 NOTE — Discharge Instructions (Signed)
You may use over-the-counter Motrin (Ibuprofen), Acetaminophen (Tylenol), topical muscle creams such as SalonPas, Icy Hot, Bengay, etc. Please stretch, apply ice or heat (whichever helps), and have massage therapy for additional assistance.  

## 2023-01-13 ENCOUNTER — Emergency Department (HOSPITAL_COMMUNITY): Payer: Self-pay

## 2023-01-13 ENCOUNTER — Encounter (HOSPITAL_COMMUNITY): Payer: Self-pay | Admitting: Emergency Medicine

## 2023-01-13 ENCOUNTER — Emergency Department (HOSPITAL_COMMUNITY)
Admission: EM | Admit: 2023-01-13 | Discharge: 2023-01-13 | Disposition: A | Discharge status: Home / Self Care | Payer: Self-pay | Attending: Emergency Medicine | Admitting: Emergency Medicine

## 2023-01-13 DIAGNOSIS — Z7951 Long term (current) use of inhaled steroids: Secondary | ICD-10-CM | POA: Insufficient documentation

## 2023-01-13 DIAGNOSIS — J45909 Unspecified asthma, uncomplicated: Secondary | ICD-10-CM | POA: Insufficient documentation

## 2023-01-13 DIAGNOSIS — R0602 Shortness of breath: Secondary | ICD-10-CM | POA: Insufficient documentation

## 2023-01-13 MED ORDER — PREDNISONE 20 MG PO TABS
60.0000 mg | ORAL_TABLET | ORAL | Status: AC
  Administered 2023-01-13: 60 mg via ORAL
  Filled 2023-01-13: qty 3

## 2023-01-13 MED ORDER — ALBUTEROL SULFATE HFA 108 (90 BASE) MCG/ACT IN AERS: 2.0000 | INHALATION_SPRAY | RESPIRATORY_TRACT | Status: DC | PRN

## 2023-01-13 MED ORDER — PREDNISONE 20 MG PO TABS
40.0000 mg | ORAL_TABLET | Freq: Every day | ORAL | 0 refills | Status: DC
Start: 2023-01-13 — End: 2023-03-26

## 2023-01-13 NOTE — ED Triage Notes (Signed)
Patient coming to ED for evaluation of shortness of breath and possible asthma flare.  Reports she woke this morning around 2 AM and felt like she was choking.  Stated "I felt like my asthma was coming on.  I took 2 puffs of my inhaler and I still don't feel like it helped."  No reports of chest pain, cough, or fevers

## 2023-01-13 NOTE — ED Provider Notes (Signed)
[place] EMERGENCY DEPARTMENT AT [facility] Provider Note   CSN: [id number] Arrival date & time: 01/13/23  2841     History  Chief Complaint  Patient presents with   Shortness of Breath    Joan Fisher is a 62 y.o. female.  HPI Patient presents with concern of dyspnea, wheezing.  She has a history of reactive airway disease, had been doing generally well until this morning when she awoke with a difficulty breathing sensation.  After some bronchodilator at home she feels somewhat better, though not completely improved.  No fever no chills, no other complaints.    Home Medications Prior to Admission medications   Medication Sig Start Date End Date Taking? Authorizing Provider  predniSONE (DELTASONE) 20 MG tablet Take 2 tablets (40 mg total) by mouth daily with breakfast. For the next four days 01/13/23  Yes Gerhard Munch, MD  albuterol (PROVENTIL) (2.5 MG/3ML) 0.083% nebulizer solution Take 3 mLs (2.5 mg total) by nebulization every 6 (six) hours as needed for wheezing or shortness of breath. 05/06/21   Sponseller, Lupe Carney R, PA-C  albuterol (VENTOLIN HFA) 108 (90 Base) MCG/ACT inhaler Inhale 1-2 puffs into the lungs every 6 (six) hours as needed for wheezing or shortness of breath. 05/06/21   Sponseller, Lupe Carney R, PA-C  amoxicillin-clavulanate (AUGMENTIN) 875-125 MG tablet Take 1 tablet by mouth every 12 (twelve) hours. 11/05/21   Achille Rich, PA-C  budesonide-formoterol (SYMBICORT) 80-4.5 MCG/ACT inhaler Inhale 2 puffs into the lungs 2 (two) times daily. 03/27/20   Ivonne Andrew, NP  cyclobenzaprine (FLEXERIL) 10 MG tablet Take 1 tablet (10 mg total) by mouth 2 (two) times daily as needed for muscle spasms. 07/21/21   Curatolo, Adam, DO  montelukast (SINGULAIR) 10 MG tablet Take 1 tablet (10 mg total) by mouth at bedtime. 03/27/20   Ivonne Andrew, NP      Allergies    Contrast media [iodinated contrast media], Oxycodone-acetaminophen, and Percocet  [oxycodone-acetaminophen]    Review of Systems   Review of Systems  All other systems reviewed and are negative.   Physical Exam Updated Vital Signs BP (!) 170/78 (BP Location: Right Arm)   Pulse 65   Temp 98.5 F (36.9 C) (Oral)   Resp 16   SpO2 100%  Physical Exam Vitals and nursing note reviewed.  Constitutional:      General: She is not in acute distress.    Appearance: She is well-developed.  HENT:     Head: Normocephalic and atraumatic.  Eyes:     Conjunctiva/sclera: Conjunctivae normal.  Cardiovascular:     Rate and Rhythm: Normal rate and regular rhythm.  Pulmonary:     Effort: Pulmonary effort is normal. Tachypnea present. No respiratory distress.     Breath sounds: No stridor. Decreased breath sounds present.  Abdominal:     General: There is no distension.  Skin:    General: Skin is warm and dry.  Neurological:     Mental Status: She is alert and oriented to person, place, and time.     Cranial Nerves: No cranial nerve deficit.  Psychiatric:        Mood and Affect: Mood normal.     ED Results / Procedures / Treatments   Labs (all labs ordered are listed, but only abnormal results are displayed) Labs Reviewed - No data to display  EKG None  Radiology DG Chest 2 View  Result Date: 01/13/2023 CLINICAL DATA:  62 year old female with shortness of breath, possible asthma exacerbation. EXAM:  CHEST - 2 VIEW COMPARISON:  Chest radiographs 05/12/2021 and earlier. FINDINGS: PA and lateral views of the chest at 0658 hours. Lung volumes and mediastinal contours are stable and within normal limits. Visualized tracheal air column is within normal limits. Suggestion of some central airway thickening. Otherwise lung markings are stable since 2008, within normal limits. No pneumothorax, pleural effusion or confluent lung opacity. Chronic postoperative changes to the right shoulder. Thoracic spine disc and endplate degeneration. No acute osseous abnormality identified.  Negative visible bowel gas. IMPRESSION: Possible central airway thickening which can be seen with viral or reactive airway disease. No other acute cardiopulmonary abnormality. Electronically Signed   By: Odessa Fleming M.D.   On: 01/13/2023 07:08    Procedures Procedures    Medications Ordered in ED Medications  albuterol (VENTOLIN HFA) 108 (90 Base) MCG/ACT inhaler 2 puff (has no administration in time range)  predniSONE (DELTASONE) tablet 60 mg (60 mg Oral Given 01/13/23 0803)    ED Course/ Medical Decision Making/ A&P                                 Medical Decision Making Obese adult female presents with dyspnea.  Given her history of asthma this is a consideration.  Patient is otherwise reassuring initial physical exam aside from mild tachypnea is reassuring, suspicion for pneumonia versus asthma exacerbation, less likely bacteremia, sepsis.  I reviewed the x-ray, no evidence for pneumonia, patient, with initiation of bronchodilators, scheduled as well as steroids, discharged in stable condition.  Amount and/or Complexity of Data Reviewed Radiology: ordered and independent interpretation performed. Decision-making details documented in ED Course.  Risk Prescription drug management. Diagnosis or treatment significantly limited by social determinants of health.  O2: 100% RA nml       Final Clinical Impression(s) / ED Diagnoses Final diagnoses:  SOB (shortness of breath)    Rx / DC Orders ED Discharge Orders          Ordered    predniSONE (DELTASONE) 20 MG tablet  Daily with breakfast        01/13/23 0804              Gerhard Munch, MD 01/13/23 [phone]

## 2023-01-13 NOTE — Discharge Instructions (Signed)
As discussed, for your breathing difficulty is important to take your steroids as prescribed.  In addition, please use either your nebulizer or your handheld albuterol every 4 hours for the next 2 days.  You may then use that medication as needed.  Be sure to follow-up with a primary care physician, either yours or with primary care clinic.  Return here for concerning changes in your condition.

## 2023-01-21 ENCOUNTER — Encounter (HOSPITAL_COMMUNITY): Payer: Self-pay

## 2023-01-21 ENCOUNTER — Other Ambulatory Visit: Payer: Self-pay

## 2023-01-21 ENCOUNTER — Emergency Department (HOSPITAL_COMMUNITY)
Admission: EM | Admit: 2023-01-21 | Discharge: 2023-01-21 | Disposition: A | Discharge status: Home / Self Care | Payer: Self-pay | Attending: Emergency Medicine | Admitting: Emergency Medicine

## 2023-01-21 DIAGNOSIS — U071 COVID-19: Secondary | ICD-10-CM

## 2023-01-21 DIAGNOSIS — Z7951 Long term (current) use of inhaled steroids: Secondary | ICD-10-CM | POA: Insufficient documentation

## 2023-01-21 DIAGNOSIS — J45909 Unspecified asthma, uncomplicated: Secondary | ICD-10-CM | POA: Insufficient documentation

## 2023-01-21 DIAGNOSIS — Z7952 Long term (current) use of systemic steroids: Secondary | ICD-10-CM | POA: Insufficient documentation

## 2023-01-21 LAB — SARS CORONAVIRUS 2 BY RT PCR: SARS Coronavirus 2 by RT PCR: POSITIVE — AB

## 2023-01-21 NOTE — ED Triage Notes (Signed)
Pt coming in today on generalized body aches, and a headache that began yesterday. 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

## 2023-01-21 NOTE — ED Provider Notes (Signed)
[place] EMERGENCY DEPARTMENT AT [facility] Provider Note   CSN: [id number] Arrival date & time: 01/21/23  7829     History  Chief Complaint  Patient presents with   Generalized Body Aches    Joan Fisher is a 62 y.o. female.  62 year old female with past medical history of asthma presents to the ED with a chief complaint of bodyaches which began yesterday.  Patient is currently employed in a daycare center, she does report traveling to [place] over the weekend, there was a woman that was coughing behind her.  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  She does endorse some congestion, cough, generalized bodyaches.  Does report taking Tylenol this morning prior to arrival, did have a temperature of 99.6 on arrival.  Denies any chest pain, shortness of breath, leg swelling.  The history is provided by the patient.       Home Medications Prior to Admission medications   Medication Sig Start Date End Date Taking? Authorizing Provider  albuterol (PROVENTIL) (2.5 MG/3ML) 0.083% nebulizer solution Take 3 mLs (2.5 mg total) by nebulization every 6 (six) hours as needed for wheezing or shortness of breath. 05/06/21   Sponseller, Lupe Carney R, PA-C  albuterol (VENTOLIN HFA) 108 (90 Base) MCG/ACT inhaler Inhale 1-2 puffs into the lungs every 6 (six) hours as needed for wheezing or shortness of breath. 05/06/21   Sponseller, Lupe Carney R, PA-C  amoxicillin-clavulanate (AUGMENTIN) 875-125 MG tablet Take 1 tablet by mouth every 12 (twelve) hours. 11/05/21   Achille Rich, PA-C  budesonide-formoterol (SYMBICORT) 80-4.5 MCG/ACT inhaler Inhale 2 puffs into the lungs 2 (two) times daily. 03/27/20   Ivonne Andrew, NP  cyclobenzaprine (FLEXERIL) 10 MG tablet Take 1 tablet (10 mg total) by mouth 2 (two) times daily as needed for muscle spasms. 07/21/21   Curatolo, Adam, DO  montelukast (SINGULAIR) 10 MG  tablet Take 1 tablet (10 mg total) by mouth at bedtime. 03/27/20   Ivonne Andrew, NP  predniSONE (DELTASONE) 20 MG tablet Take 2 tablets (40 mg total) by mouth daily with breakfast. For the next four days 01/13/23   Gerhard Munch, MD      Allergies    Contrast media [iodinated contrast media], Oxycodone-acetaminophen, and Percocet [oxycodone-acetaminophen]    Review of Systems   Review of Systems  Constitutional:  Negative for chills and fever.  HENT:  Positive for rhinorrhea.   Respiratory:  Negative for shortness of breath.   Cardiovascular:  Negative for chest pain.    Physical Exam Updated Vital Signs BP (!) 158/86 (BP Location: Left Arm)   Pulse 77   Temp 99.6 F (37.6 C) (Oral)   Resp 18   Ht 5\' 2"  (1.575 m)   Wt 113.4 kg   SpO2 99%   BMI 45.73 kg/m  Physical Exam Vitals and nursing note reviewed.  Constitutional:      Appearance: Normal appearance.  HENT:     Head: Normocephalic and atraumatic.     Nose: Congestion present.     Mouth/Throat:     Mouth: Mucous membranes are moist.     Comments: Mild erythema noted, no PTA, no tonsillar exudates. Cardiovascular:     Rate and Rhythm: Normal rate.  Pulmonary:     Effort: Pulmonary effort is normal.     Breath sounds: No wheezing.     Comments: No wheezing, rhonchi or rales. Abdominal:  General: Abdomen is flat.  Musculoskeletal:     Cervical back: Normal range of motion and neck supple.  Lymphadenopathy:     Cervical: Cervical adenopathy present.  Skin:    General: Skin is warm and dry.  Neurological:     Mental Status: She is alert and oriented to person, place, and time.     ED Results / Procedures / Treatments   Labs (all labs ordered are listed, but only abnormal results are displayed) Labs Reviewed  SARS CORONAVIRUS 2 BY RT PCR - Abnormal; Notable for the following components:      Result Value   SARS Coronavirus 2 by RT PCR POSITIVE (*)    All other components within normal limits     EKG None  Radiology No results found.  Procedures Procedures    Medications Ordered in ED Medications - No data to display  ED Course/ Medical Decision Making/ A&P Clinical Course as of 01/21/23 0849  Wed Jan 21, 2023  0848 SARS Coronavirus 2 by RT PCR(!): POSITIVE [JS]    Clinical Course User Index [JS] Claude Manges, PA-C                                 Medical Decision Making Amount and/or Complexity of Data Reviewed Labs:  Decision-making details documented in ED Course.    Patient here with 1 day of upper respiratory symptoms, does report being on a flight from [place] and the person behind her was coughing a lot.  On evaluation she is overall hemodynamically stable, does have a low-grade temperature of 99.6, but did take Tylenol prior to arrival in the emergency department.  Her lungs are clear to auscultation without any wheezing or rales.  She does have some nasal congestion and some left cervical adenopathy but no PTA or tonsillar exudates noted.  Abdomen is soft.  Moves upper and lower extremities but does report some myalgias.  Tested for COVID-19 on today's visit.  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  Respiratory panel is positive for covid 19 on todays visit. We discussed symptomatic treatment at home.  Will provide her with a note to remain absent from daycare center.Hemodynamically stable for discharge.     Portions of this note were generated with Scientist, clinical (histocompatibility and immunogenetics). Dictation errors may occur despite best attempts at proofreading.   Final Clinical Impression(s) / ED Diagnoses Final diagnoses:  Lab test positive for detection of COVID-19 virus    Rx / DC Orders ED Discharge Orders     None         Claude Manges, PA-C 01/21/23 4098    Wynetta Fines, MD 01/21/23 [phone]

## 2023-01-21 NOTE — Discharge Instructions (Signed)
You tested positive for covid 19 on todays visit, please remain at home for the next 5 days, You may return to work with a mask for the following week.  Continue tylenol and ibuprofen to help with fevers.

## 2023-03-26 ENCOUNTER — Emergency Department (HOSPITAL_COMMUNITY)
Admission: EM | Admit: 2023-03-26 | Discharge: 2023-03-26 | Disposition: A | Discharge status: Home / Self Care | Payer: Self-pay | Attending: Emergency Medicine | Admitting: Emergency Medicine

## 2023-03-26 ENCOUNTER — Encounter (HOSPITAL_COMMUNITY): Payer: Self-pay

## 2023-03-26 ENCOUNTER — Other Ambulatory Visit: Payer: Self-pay

## 2023-03-26 ENCOUNTER — Emergency Department (HOSPITAL_COMMUNITY): Payer: Self-pay

## 2023-03-26 DIAGNOSIS — Z1152 Encounter for screening for COVID-19: Secondary | ICD-10-CM | POA: Insufficient documentation

## 2023-03-26 DIAGNOSIS — J4521 Mild intermittent asthma with (acute) exacerbation: Secondary | ICD-10-CM | POA: Insufficient documentation

## 2023-03-26 LAB — CBC WITH DIFFERENTIAL/PLATELET
Abs Immature Granulocytes: 0.01 10*3/uL (ref 0.00–0.07)
Basophils Absolute: 0.1 10*3/uL (ref 0.0–0.1)
Basophils Relative: 1 %
Eosinophils Absolute: 0.2 10*3/uL (ref 0.0–0.5)
Eosinophils Relative: 3 %
HCT: 37.4 % (ref 36.0–46.0)
Hemoglobin: 11.8 g/dL — ABNORMAL LOW (ref 12.0–15.0)
Immature Granulocytes: 0 %
Lymphocytes Relative: 30 %
Lymphs Abs: 1.9 10*3/uL (ref 0.7–4.0)
MCH: 29.1 pg (ref 26.0–34.0)
MCHC: 31.6 g/dL (ref 30.0–36.0)
MCV: 92.1 fL (ref 80.0–100.0)
Monocytes Absolute: 0.4 10*3/uL (ref 0.1–1.0)
Monocytes Relative: 7 %
Neutro Abs: 3.6 10*3/uL (ref 1.7–7.7)
Neutrophils Relative %: 59 %
Platelets: 245 10*3/uL (ref 150–400)
RBC: 4.06 MIL/uL (ref 3.87–5.11)
RDW: 13.2 % (ref 11.5–15.5)
WBC: 6.1 10*3/uL (ref 4.0–10.5)
nRBC: 0 % (ref 0.0–0.2)

## 2023-03-26 LAB — RESP PANEL BY RT-PCR (RSV, FLU A&B, COVID)  RVPGX2
Influenza A by PCR: NEGATIVE
Influenza B by PCR: NEGATIVE
Resp Syncytial Virus by PCR: NEGATIVE
SARS Coronavirus 2 by RT PCR: NEGATIVE

## 2023-03-26 LAB — BASIC METABOLIC PANEL
Anion gap: 6 (ref 5–15)
BUN: 11 mg/dL (ref 8–23)
CO2: 28 mmol/L (ref 22–32)
Calcium: 9.5 mg/dL (ref 8.9–10.3)
Chloride: 110 mmol/L (ref 98–111)
Creatinine, Ser: 0.82 mg/dL (ref 0.44–1.00)
GFR, Estimated: >60 mL/min (ref >60)
Glucose, Bld: 94 mg/dL (ref 70–99)
Potassium: 4.1 mmol/L (ref 3.5–5.1)
Sodium: 144 mmol/L (ref 135–145)

## 2023-03-26 MED ORDER — PREDNISONE 20 MG PO TABS: ORAL_TABLET | ORAL | 0 refills | Status: DC

## 2023-03-26 MED ORDER — PREDNISONE 20 MG PO TABS
60.0000 mg | ORAL_TABLET | Freq: Once | ORAL | Status: AC
  Administered 2023-03-26: 60 mg via ORAL
  Filled 2023-03-26: qty 3

## 2023-03-26 MED ORDER — ALBUTEROL SULFATE HFA 108 (90 BASE) MCG/ACT IN AERS: 1.0000 | INHALATION_SPRAY | Freq: Four times a day (QID) | RESPIRATORY_TRACT | 1 refills | Status: AC | PRN

## 2023-03-26 NOTE — ED Provider Triage Note (Addendum)
Emergency Medicine Provider Triage Evaluation Note  Joan Fisher , a 62 y.o. female  was evaluated in triage.  Pt complains of cough and worsening asthma.  Review of Systems  Positive:  Negative:   Physical Exam  BP (!) 146/97 (BP Location: Left Arm)   Pulse 70   Temp 98.3 F (36.8 C) (Oral)   Resp 18   Ht 5\' 2"  (1.575 m)   Wt 113.4 kg   SpO2 99%   BMI 45.73 kg/m  Gen:   Awake, no distress   Resp:  Normal effort  MSK:   Moves extremities without difficulty  Other:    Medical Decision Making  Medically screening exam initiated at 12:23 PM.  Appropriate orders placed.  Joan Fisher was informed that the remainder of the evaluation will be completed by another provider, this initial triage assessment does not replace that evaluation, and the importance of remaining in the ED until their evaluation is complete.  Concerned for worsening asthma/SOB x2 days. Also with cough 2 days ago. Endorses hemoptysis. Taking rescue inhaler twice daily. No other medications for asthma. Last time this happened to patient she states she had PNA.  Denies fever, chest pain, nausea, vomiting, diarrhea. Denies recent surgery/immobilization, hx DT/PE, hx cancer in the past 6 months, calf swelling/tenderness.        Valrie Hart F, [place] 03/26/23 1226

## 2023-03-26 NOTE — ED Triage Notes (Addendum)
Patient said for 2 days her asthma has worsened. Short of breath worsens when she walks. Took 2 puffs of albuterol this morning. Has a cough with bloody streaks in it. Denies chest pain.

## 2023-03-26 NOTE — ED Provider Notes (Signed)
[place] EMERGENCY DEPARTMENT AT [facility] Provider Note   CSN: [id number] Arrival date & time: 03/26/23  1135     History  Chief Complaint  Patient presents with   Asthma    Joan Fisher is a 62 y.o. female.  Patient has history of asthma.  Patient complains of shortness of breath  The history is provided by the patient and medical records. No language interpreter was used.  Asthma This is a new problem. The current episode started 12 to 24 hours ago. The problem occurs constantly. The problem has not changed since onset.Pertinent negatives include no chest pain, no abdominal pain and no headaches. Nothing aggravates the symptoms. Nothing relieves the symptoms. She has tried nothing for the symptoms. The treatment provided no relief.       Home Medications Prior to Admission medications   Medication Sig Start Date End Date Taking? Authorizing Provider  albuterol (VENTOLIN HFA) 108 (90 Base) MCG/ACT inhaler Inhale 1-2 puffs into the lungs every 6 (six) hours as needed for wheezing or shortness of breath. 03/26/23  Yes Bethann Berkshire, MD  predniSONE (DELTASONE) 20 MG tablet 2 tabs po daily x 3 days 03/26/23  Yes Bethann Berkshire, MD  amoxicillin-clavulanate (AUGMENTIN) 875-125 MG tablet Take 1 tablet by mouth every 12 (twelve) hours. 11/05/21   Achille Rich, PA-C  budesonide-formoterol (SYMBICORT) 80-4.5 MCG/ACT inhaler Inhale 2 puffs into the lungs 2 (two) times daily. 03/27/20   Ivonne Andrew, NP  cyclobenzaprine (FLEXERIL) 10 MG tablet Take 1 tablet (10 mg total) by mouth 2 (two) times daily as needed for muscle spasms. 07/21/21   Curatolo, Adam, DO  montelukast (SINGULAIR) 10 MG tablet Take 1 tablet (10 mg total) by mouth at bedtime. 03/27/20   Ivonne Andrew, NP      Allergies    Contrast media [iodinated contrast media], Oxycodone-acetaminophen, and Percocet [oxycodone-acetaminophen]    Review of Systems   Review of Systems  Constitutional:  Negative  for appetite change and fatigue.  HENT:  Negative for congestion, ear discharge and sinus pressure.   Eyes:  Negative for discharge.  Respiratory:  Positive for wheezing. Negative for cough.   Cardiovascular:  Negative for chest pain.  Gastrointestinal:  Negative for abdominal pain and diarrhea.  Genitourinary:  Negative for frequency and hematuria.  Musculoskeletal:  Negative for back pain.  Skin:  Negative for rash.  Neurological:  Negative for seizures and headaches.  Psychiatric/Behavioral:  Negative for hallucinations.     Physical Exam Updated Vital Signs BP (!) 158/74 (BP Location: Left Arm)   Pulse 69   Temp 98.1 F (36.7 C) (Oral)   Resp 18   Ht 5\' 2"  (1.575 m)   Wt 113.4 kg   SpO2 99%   BMI 45.73 kg/m  Physical Exam Vitals and nursing note reviewed.  Constitutional:      Appearance: She is well-developed.  HENT:     Head: Normocephalic.     Nose: Nose normal.  Eyes:     General: No scleral icterus.    Conjunctiva/sclera: Conjunctivae normal.  Neck:     Thyroid: No thyromegaly.  Cardiovascular:     Rate and Rhythm: Normal rate and regular rhythm.     Heart sounds: No murmur heard.    No friction rub. No gallop.  Pulmonary:     Breath sounds: No stridor. Wheezing present. No rales.  Chest:     Chest wall: No tenderness.  Abdominal:     General: There is no distension.  Tenderness: There is no abdominal tenderness. There is no rebound.  Musculoskeletal:        General: Normal range of motion.     Cervical back: Neck supple.  Lymphadenopathy:     Cervical: No cervical adenopathy.  Skin:    Findings: No erythema or rash.  Neurological:     Mental Status: She is alert and oriented to person, place, and time.     Motor: No abnormal muscle tone.     Coordination: Coordination normal.  Psychiatric:        Behavior: Behavior normal.     ED Results / Procedures / Treatments   Labs (all labs ordered are listed, but only abnormal results are  displayed) Labs Reviewed  CBC WITH DIFFERENTIAL/PLATELET - Abnormal; Notable for the following components:      Result Value   Hemoglobin 11.8 (*)    All other components within normal limits  RESP PANEL BY RT-PCR (RSV, FLU A&B, COVID)  RVPGX2  BASIC METABOLIC PANEL    EKG None  Radiology DG Chest 2 View  Result Date: 03/26/2023 CLINICAL DATA:  Cough EXAM: CHEST - 2 VIEW COMPARISON:  X-ray 01/13/2023 and older FINDINGS: The heart size and mediastinal contours are within normal limits. No consolidation, pneumothorax or effusion. No edema. The visualized skeletal structures are unremarkable. Calcified aorta. Degenerative changes of the spine. Fixation screw along the right shoulder, stable. IMPRESSION: No acute cardiopulmonary disease. Electronically Signed   By: Karen Kays M.D.   On: 03/26/2023 14:48    Procedures Procedures    Medications Ordered in ED Medications  predniSONE (DELTASONE) tablet 60 mg (60 mg Oral Given 03/26/23 1821)    ED Course/ Medical Decision Making/ A&P                                 Medical Decision Making Risk Prescription drug management.  This patient presents to the ED for concern of shortness of breath, this involves an extensive number of treatment options, and is a complaint that carries with it a high risk of complications and morbidity.  The differential diagnosis includes asthma exacerbation   Co morbidities that complicate the patient evaluation  Asthma   Additional history obtained:  Additional history obtained from patient External records from outside source obtained and reviewed including hospital records   Lab Tests:  I Ordered, and personally interpreted labs.  The pertinent results include: CBC and chemistry unremarkable   Imaging Studies ordered:  I ordered imaging studies including chest x-ray I independently visualized and interpreted imaging which showed no acute disease I agree with the radiologist  interpretation   Cardiac Monitoring: / EKG:  The patient was maintained on a cardiac monitor.  I personally viewed and interpreted the cardiac monitored which showed an underlying rhythm of: Normal sinus rhythm   Consultations Obtained: No consultant  Problem List / ED Course / Critical interventions / Medication management  Asthma shortness of breath I ordered medication including albuterol and steroids Reevaluation of the patient after these medicines showed that the patient improved I have reviewed the patients home medicines and have made adjustments as needed   Social Determinants of Health:  None   Test / Admission - Considered:  None  Patient with asthma exacerbation.  She improved with using her albuterol inhaler.  We will give her some prednisone and another albuterol inhaler and she will follow-up with PCP  Final Clinical Impression(s) / ED Diagnoses Final diagnoses:  Mild intermittent asthma with exacerbation    Rx / DC Orders ED Discharge Orders          Ordered    albuterol (VENTOLIN HFA) 108 (90 Base) MCG/ACT inhaler  Every 6 hours PRN        03/26/23 1837    predniSONE (DELTASONE) 20 MG tablet        03/26/23 1837              Bethann Berkshire, MD 03/31/23 1117

## 2023-03-26 NOTE — Discharge Instructions (Signed)
Follow-up with your family doctor if not improving 

## 2023-06-28 ENCOUNTER — Encounter (HOSPITAL_COMMUNITY): Payer: Self-pay

## 2023-06-28 ENCOUNTER — Other Ambulatory Visit: Payer: Self-pay

## 2023-06-28 ENCOUNTER — Emergency Department (HOSPITAL_COMMUNITY)
Admission: EM | Admit: 2023-06-28 | Discharge: 2023-06-28 | Disposition: A | Discharge status: Home / Self Care | Payer: Self-pay | Attending: Emergency Medicine | Admitting: Emergency Medicine

## 2023-06-28 ENCOUNTER — Emergency Department (HOSPITAL_COMMUNITY)
Admission: EM | Admit: 2023-06-28 | Discharge: 2023-06-28 | Discharge status: Against Medical Advice | Payer: Self-pay | Source: Home / Self Care

## 2023-06-28 ENCOUNTER — Emergency Department (HOSPITAL_COMMUNITY): Payer: Self-pay

## 2023-06-28 DIAGNOSIS — J45901 Unspecified asthma with (acute) exacerbation: Secondary | ICD-10-CM | POA: Insufficient documentation

## 2023-06-28 DIAGNOSIS — Z20822 Contact with and (suspected) exposure to covid-19: Secondary | ICD-10-CM | POA: Insufficient documentation

## 2023-06-28 DIAGNOSIS — J111 Influenza due to unidentified influenza virus with other respiratory manifestations: Secondary | ICD-10-CM

## 2023-06-28 DIAGNOSIS — Z7951 Long term (current) use of inhaled steroids: Secondary | ICD-10-CM | POA: Insufficient documentation

## 2023-06-28 DIAGNOSIS — J101 Influenza due to other identified influenza virus with other respiratory manifestations: Secondary | ICD-10-CM | POA: Insufficient documentation

## 2023-06-28 LAB — RESP PANEL BY RT-PCR (RSV, FLU A&B, COVID)  RVPGX2
Influenza A by PCR: POSITIVE — AB
Influenza B by PCR: NEGATIVE
Resp Syncytial Virus by PCR: NEGATIVE
SARS Coronavirus 2 by RT PCR: NEGATIVE

## 2023-06-28 MED ORDER — OSELTAMIVIR PHOSPHATE 75 MG PO CAPS
75.0000 mg | ORAL_CAPSULE | Freq: Once | ORAL | Status: AC
  Administered 2023-06-28: 75 mg via ORAL
  Filled 2023-06-28: qty 1

## 2023-06-28 MED ORDER — ACETAMINOPHEN 500 MG PO TABS
1000.0000 mg | ORAL_TABLET | Freq: Once | ORAL | Status: AC
  Administered 2023-06-28: 1000 mg via ORAL
  Filled 2023-06-28: qty 2

## 2023-06-28 MED ORDER — PREDNISONE 10 MG PO TABS: 40.0000 mg | ORAL_TABLET | Freq: Every day | ORAL | 0 refills | Status: DC

## 2023-06-28 MED ORDER — PREDNISONE 20 MG PO TABS
20.0000 mg | ORAL_TABLET | Freq: Once | ORAL | Status: AC
  Administered 2023-06-28: 20 mg via ORAL
  Filled 2023-06-28: qty 1

## 2023-06-28 MED ORDER — OSELTAMIVIR PHOSPHATE 75 MG PO CAPS
75.0000 mg | ORAL_CAPSULE | Freq: Two times a day (BID) | ORAL | 0 refills | Status: AC
Start: 2023-06-28 — End: ?

## 2023-06-28 NOTE — Discharge Instructions (Signed)
 Return for any problem.  ?

## 2023-06-28 NOTE — ED Triage Notes (Signed)
Pt BIB EMS from home for 2x days SOB and a cough for two weeks. Pt took 40mg  of prednisone and her inhaler with no relief.  Hx asthma  5mg  albuterol 0.5mg  atrovent  95% RA 142/86 100 hr 18 rr

## 2023-06-28 NOTE — ED Provider Notes (Signed)
[place] EMERGENCY DEPARTMENT AT [facility] Provider Note   CSN: [id number] Arrival date & time: 06/28/23  1945     History  Chief Complaint  Patient presents with   Cough   Shortness of Breath    Joan Fisher is a 63 y.o. female.  63 year old female with prior medical history as detailed below presents for evaluation.  Patient with history of asthma.  She works in a childcare situation.  She reports multiple sick contacts.  Over the last 2 days she has noticed increased cough congestion and mild wheezing.  This afternoon around 2 PM she took a total of 40 mg of prednisone that she had leftover.  Over the course of the afternoon her wheezing has improved somewhat.  She denies fever.  She reports a home COVID test that was negative.  EMS transported without difficulty.  She was given albuterol and Atrovent during transport.  On arrival to the ED she reports that she feels much improved.  The history is provided by the patient and medical records.       Home Medications Prior to Admission medications   Medication Sig Start Date End Date Taking? Authorizing Provider  albuterol (VENTOLIN HFA) 108 (90 Base) MCG/ACT inhaler Inhale 1-2 puffs into the lungs every 6 (six) hours as needed for wheezing or shortness of breath. 03/26/23   Bethann Berkshire, MD  amoxicillin-clavulanate (AUGMENTIN) 875-125 MG tablet Take 1 tablet by mouth every 12 (twelve) hours. 11/05/21   Achille Rich, PA-C  budesonide-formoterol (SYMBICORT) 80-4.5 MCG/ACT inhaler Inhale 2 puffs into the lungs 2 (two) times daily. 03/27/20   Ivonne Andrew, NP  cyclobenzaprine (FLEXERIL) 10 MG tablet Take 1 tablet (10 mg total) by mouth 2 (two) times daily as needed for muscle spasms. 07/21/21   Curatolo, Adam, DO  montelukast (SINGULAIR) 10 MG tablet Take 1 tablet (10 mg total) by mouth at bedtime. 03/27/20   Ivonne Andrew, NP  predniSONE (DELTASONE) 20 MG tablet 2 tabs po daily x 3 days 03/26/23   Bethann Berkshire, MD      Allergies    Contrast media [iodinated contrast media], Oxycodone-acetaminophen, and Percocet [oxycodone-acetaminophen]    Review of Systems   Review of Systems  All other systems reviewed and are negative.   Physical Exam Updated Vital Signs BP 112/78   Pulse (!) 109   Temp 99 F (37.2 C)   Resp 18   SpO2 96%  Physical Exam Vitals and nursing note reviewed.  Constitutional:      General: She is not in acute distress.    Appearance: Normal appearance. She is well-developed.  HENT:     Head: Normocephalic and atraumatic.  Eyes:     Conjunctiva/sclera: Conjunctivae normal.     Pupils: Pupils are equal, round, and reactive to light.  Cardiovascular:     Rate and Rhythm: Normal rate and regular rhythm.     Heart sounds: Normal heart sounds.  Pulmonary:     Effort: Pulmonary effort is normal. No respiratory distress.     Breath sounds: Normal breath sounds.  Abdominal:     General: There is no distension.     Palpations: Abdomen is soft.     Tenderness: There is no abdominal tenderness.  Musculoskeletal:        General: No deformity. Normal range of motion.     Cervical back: Normal range of motion and neck supple.  Skin:    General: Skin is warm and dry.  Neurological:  General: No focal deficit present.     Mental Status: She is alert and oriented to person, place, and time.     ED Results / Procedures / Treatments   Labs (all labs ordered are listed, but only abnormal results are displayed) Labs Reviewed  RESP PANEL BY RT-PCR (RSV, FLU A&B, COVID)  RVPGX2    EKG None  Radiology No results found.  Procedures Procedures    Medications Ordered in ED Medications  predniSONE (DELTASONE) tablet 20 mg (has no administration in time range)    ED Course/ Medical Decision Making/ A&P                                 Medical Decision Making Amount and/or Complexity of Data Reviewed Radiology: ordered.  Risk OTC drugs. Prescription  drug management.   Medical Screen Complete  This patient presented to the ED with complaint of cough, wheezing.  This complaint involves an extensive number of treatment options. The initial differential diagnosis includes, but is not limited to, asthma exacerbation, viral syndrome  This presentation is: Acute, Chronic, Self-Limited, Previously Undiagnosed, Uncertain Prognosis, Complicated, and Systemic Symptoms  Patient is presenting with cough, congestion, URI symptoms.  Patient with multiple sick contacts at at her work.  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  Patient with improvement with albuterol.  She is already taken 40 mg of prednisone earlier today.  Initial dose of 20 mg was taken to bring her total initial dose today up to 60 mg.  Flu testing is positive.  Chest x-ray is without evidence of pneumonia.  Patient is much improved after ED evaluation.  She desires discharge home.    Patient would benefit from short course of prednisone for treatment of bronchospasm.  Patient is also agreeable with trialing Tamiflu for treatment of acute influenza.  Importance of close follow-up is stressed.  Strict return precautions given and understood.  Additional history obtained:  External records from outside sources obtained and reviewed including prior ED visits and prior Inpatient records.    Lab Tests:  I ordered and personally interpreted labs.  The pertinent results include: COVID, flu, RSV   Imaging Studies ordered:  I ordered imaging studies including chest x-ray I independently visualized and interpreted obtained imaging which showed NAD I agree with the radiologist interpretation.   Problem List / ED Course:  Asthma exacerbation, acute influenza   Reevaluation:  After the interventions noted above, I reevaluated the patient and found that they have: improved    Disposition:  After consideration of the diagnostic results and the patients response to  treatment, I feel that the patent would benefit from close outpatient follow-up.          Final Clinical Impression(s) / ED Diagnoses Final diagnoses:  Influenza    Rx / DC Orders ED Discharge Orders          Ordered    predniSONE (DELTASONE) 10 MG tablet  Daily        06/28/23 2139    oseltamivir (TAMIFLU) 75 MG capsule  Every 12 hours        06/28/23 2139              Wynetta Fines, MD 06/28/23 2144

## 2023-08-30 ENCOUNTER — Encounter (HOSPITAL_COMMUNITY): Payer: Self-pay

## 2023-08-30 ENCOUNTER — Emergency Department (HOSPITAL_COMMUNITY)
Admission: EM | Admit: 2023-08-30 | Discharge: 2023-08-30 | Disposition: A | Discharge status: Home / Self Care | Payer: Self-pay | Attending: Emergency Medicine | Admitting: Emergency Medicine

## 2023-08-30 ENCOUNTER — Emergency Department (HOSPITAL_COMMUNITY): Payer: Self-pay

## 2023-08-30 ENCOUNTER — Other Ambulatory Visit: Payer: Self-pay

## 2023-08-30 ENCOUNTER — Emergency Department (HOSPITAL_COMMUNITY)
Admission: EM | Admit: 2023-08-30 | Discharge: 2023-08-30 | Discharge status: Against Medical Advice | Payer: Self-pay | Attending: Emergency Medicine | Admitting: Emergency Medicine

## 2023-08-30 ENCOUNTER — Encounter (HOSPITAL_COMMUNITY): Payer: Self-pay | Admitting: Emergency Medicine

## 2023-08-30 DIAGNOSIS — Z7951 Long term (current) use of inhaled steroids: Secondary | ICD-10-CM | POA: Insufficient documentation

## 2023-08-30 DIAGNOSIS — H9201 Otalgia, right ear: Secondary | ICD-10-CM | POA: Insufficient documentation

## 2023-08-30 DIAGNOSIS — M542 Cervicalgia: Secondary | ICD-10-CM | POA: Insufficient documentation

## 2023-08-30 DIAGNOSIS — M6790 Unspecified disorder of synovium and tendon, unspecified site: Secondary | ICD-10-CM | POA: Insufficient documentation

## 2023-08-30 DIAGNOSIS — M779 Enthesopathy, unspecified: Secondary | ICD-10-CM

## 2023-08-30 DIAGNOSIS — J45909 Unspecified asthma, uncomplicated: Secondary | ICD-10-CM | POA: Insufficient documentation

## 2023-08-30 LAB — COMPREHENSIVE METABOLIC PANEL WITH GFR
ALT: 17 U/L (ref 0–44)
AST: 20 U/L (ref 15–41)
Albumin: 4 g/dL (ref 3.5–5.0)
Alkaline Phosphatase: 62 U/L (ref 38–126)
Anion gap: 9 (ref 5–15)
BUN: 9 mg/dL (ref 8–23)
CO2: 24 mmol/L (ref 22–32)
Calcium: 9.1 mg/dL (ref 8.9–10.3)
Chloride: 109 mmol/L (ref 98–111)
Creatinine, Ser: 0.54 mg/dL (ref 0.44–1.00)
GFR, Estimated: >60 mL/min (ref >60)
Glucose, Bld: 113 mg/dL — ABNORMAL HIGH (ref 70–99)
Potassium: 3.9 mmol/L (ref 3.5–5.1)
Sodium: 142 mmol/L (ref 135–145)
Total Bilirubin: 0.8 mg/dL (ref 0.0–1.2)
Total Protein: 8 g/dL (ref 6.5–8.1)

## 2023-08-30 LAB — CBC WITH DIFFERENTIAL/PLATELET
Abs Immature Granulocytes: 0.01 10*3/uL (ref 0.00–0.07)
Basophils Absolute: 0 10*3/uL (ref 0.0–0.1)
Basophils Relative: 1 %
Eosinophils Absolute: 0.2 10*3/uL (ref 0.0–0.5)
Eosinophils Relative: 2 %
HCT: 40.3 % (ref 36.0–46.0)
Hemoglobin: 12.9 g/dL (ref 12.0–15.0)
Immature Granulocytes: 0 %
Lymphocytes Relative: 23 %
Lymphs Abs: 1.5 10*3/uL (ref 0.7–4.0)
MCH: 29.5 pg (ref 26.0–34.0)
MCHC: 32 g/dL (ref 30.0–36.0)
MCV: 92.2 fL (ref 80.0–100.0)
Monocytes Absolute: 0.4 10*3/uL (ref 0.1–1.0)
Monocytes Relative: 6 %
Neutro Abs: 4.4 10*3/uL (ref 1.7–7.7)
Neutrophils Relative %: 68 %
Platelets: 209 10*3/uL (ref 150–400)
RBC: 4.37 MIL/uL (ref 3.87–5.11)
RDW: 13.3 % (ref 11.5–15.5)
WBC: 6.5 10*3/uL (ref 4.0–10.5)
nRBC: 0 % (ref 0.0–0.2)

## 2023-08-30 LAB — GROUP A STREP BY PCR: Group A Strep by PCR: NOT DETECTED

## 2023-08-30 MED ORDER — PREDNISONE 50 MG PO TABS
50.0000 mg | ORAL_TABLET | Freq: Every day | ORAL | 0 refills | Status: AC
Start: 2023-08-30 — End: ?

## 2023-08-30 MED ORDER — LORAZEPAM 2 MG/ML IJ SOLN: 0.5000 mg | Freq: Once | INTRAMUSCULAR | Status: DC

## 2023-08-30 MED ORDER — AMOXICILLIN-POT CLAVULANATE 875-125 MG PO TABS: 1.0000 | ORAL_TABLET | Freq: Two times a day (BID) | ORAL | 0 refills | Status: AC

## 2023-08-30 MED ORDER — SODIUM CHLORIDE 0.9 % IV SOLN
3.0000 g | Freq: Once | INTRAVENOUS | Status: AC
  Administered 2023-08-30: 3 g via INTRAVENOUS
  Filled 2023-08-30: qty 8

## 2023-08-30 MED ORDER — ACETAMINOPHEN 325 MG PO TABS
650.0000 mg | ORAL_TABLET | Freq: Once | ORAL | Status: AC
  Administered 2023-08-30: 650 mg via ORAL
  Filled 2023-08-30: qty 2

## 2023-08-30 MED ORDER — ACETAMINOPHEN 500 MG PO TABS
1000.0000 mg | ORAL_TABLET | Freq: Once | ORAL | Status: AC
  Administered 2023-08-30: 1000 mg via ORAL
  Filled 2023-08-30: qty 2

## 2023-08-30 MED ORDER — GADOBUTROL 1 MMOL/ML IV SOLN
10.0000 mL | Freq: Once | INTRAVENOUS | Status: AC | PRN
  Administered 2023-08-30: 10 mL via INTRAVENOUS

## 2023-08-30 NOTE — ED Notes (Signed)
 Pt ambulated out of room saying she does not want an IV, labs, or scans at this time. A few minutes later, pt walked back out and states she is going to leave. Pt is informed this would be AMA, and pt acknowledges understanding. Pt signs AMA form.

## 2023-08-30 NOTE — ED Triage Notes (Signed)
 Patient stated she began having right sided neck pain since last night. Was seen yesterday. Left before getting a MRI.

## 2023-08-30 NOTE — ED Triage Notes (Signed)
 Pt to ED from home c/o right ear pain radiating around neck that started suddenly tonight around 2200.

## 2023-08-30 NOTE — ED Provider Notes (Cosign Needed Addendum)
 [place] EMERGENCY DEPARTMENT AT [facility] Provider Note   CSN: [id number] Arrival date & time: 08/30/23  0865     History Chief Complaint  Patient presents with   Neck Pain    Joan Fisher is a 63 y.o. female with history of asthma presents to the emerged from today for evaluation of right sided ear and neck pain.  She reports that around midnight today that she started develop some pain at the right ear and down through the lateral aspect of the neck.  She reports that she was watching television when this happened.  She denies any tinnitus or drainage.  Was reporting some pain with swallowing.  She does have poor dentition however has not had any dental pain.  No known fevers.  No chest pain or shortness of breath.  No difficulty in swallowing.  No recent traumas or injury. She was recently seen < 1 hour prior but left before her MRI. She reports that she had to take care of something at home before returning.    Neck Pain      Home Medications Prior to Admission medications   Medication Sig Start Date End Date Taking? Authorizing Provider  albuterol (VENTOLIN HFA) 108 (90 Base) MCG/ACT inhaler Inhale 1-2 puffs into the lungs every 6 (six) hours as needed for wheezing or shortness of breath. 03/26/23   Bethann Berkshire, MD  amoxicillin-clavulanate (AUGMENTIN) 875-125 MG tablet Take 1 tablet by mouth every 12 (twelve) hours. Patient not taking: Reported on 08/30/2023 11/05/21   Achille Rich, PA-C  budesonide-formoterol [facility]) 80-4.5 MCG/ACT inhaler Inhale 2 puffs into the lungs 2 (two) times daily. Patient not taking: Reported on 08/30/2023 03/27/20   Ivonne Andrew, NP  cyclobenzaprine (FLEXERIL) 10 MG tablet Take 1 tablet (10 mg total) by mouth 2 (two) times daily as needed for muscle spasms. Patient not taking: Reported on 08/30/2023 07/21/21   Virgina Norfolk, DO  montelukast (SINGULAIR) 10 MG tablet Take 1 tablet (10 mg total) by mouth at bedtime. Patient not  taking: Reported on 08/30/2023 03/27/20   Ivonne Andrew, NP  oseltamivir (TAMIFLU) 75 MG capsule Take 1 capsule (75 mg total) by mouth every 12 (twelve) hours. Patient not taking: Reported on 08/30/2023 06/28/23   Wynetta Fines, MD  predniSONE (DELTASONE) 10 MG tablet Take 4 tablets (40 mg total) by mouth daily. Patient not taking: Reported on 08/30/2023 06/28/23   Wynetta Fines, MD  predniSONE (DELTASONE) 20 MG tablet 2 tabs po daily x 3 days Patient not taking: Reported on 08/30/2023 03/26/23   Bethann Berkshire, MD      Allergies    Contrast media [iodinated contrast media], Oxycodone-acetaminophen, Percocet [oxycodone-acetaminophen], and Motrin [ibuprofen]    Review of Systems   Review of Systems  Musculoskeletal:  Positive for neck pain. Negative for neck stiffness.  See HPI  Physical Exam Updated Vital Signs BP (!) 182/88   Pulse 65   Temp 98.1 F (36.7 C) (Oral)   Resp 17   Ht 5\' 2"  (1.575 m)   Wt 113 kg   SpO2 100%   BMI 45.56 kg/m  Physical Exam Vitals and nursing note reviewed.  Constitutional:      General: She is not in acute distress.    Appearance: She is not ill-appearing or toxic-appearing.  HENT:     Right Ear: Tympanic membrane, ear canal and external ear normal.     Left Ear: Tympanic membrane, ear canal and external ear normal.  Ears:     Comments: No tenderness to the mastoid.  No overlying skin changes, increase in size, or overlying erythema or increase in warmth.    Mouth/Throat:     Mouth: Mucous membranes are moist.     Comments: Poor dentition noted throughout.  No palpable fluctuance or induration.  No sublingual elevation.  Moist membranes.  Uvula midline.  Airway patent.  No erythema, edema, or exudate noted in the posterior oropharynx. No palate deviation appreciated. Normal voice.  Eyes:     General: No scleral icterus. Neck:     Vascular: No carotid bruit.      Comments: Tenderness in the zone of the upper area marked.  There is some  possible slight swelling noted but no overlying erythema or increase in warmth.  There is no crepitus to the area.  Nonpulsatile.  I do not appreciate any carotid bruit on auscultation.  There is no stridor.  She speaking full sentences with ease without any acute distress.  Controlling secretions.  She does still have full range of motion.  No nuchal rigidity.  I do not appreciate any lymphadenopathy. Cardiovascular:     Rate and Rhythm: Normal rate.  Pulmonary:     Effort: Pulmonary effort is normal.     Breath sounds: Normal breath sounds. No stridor.  Musculoskeletal:     Cervical back: Tenderness present. No rigidity.  Lymphadenopathy:     Cervical: No cervical adenopathy.  Skin:    General: Skin is warm and dry.  Neurological:     Mental Status: She is alert. Mental status is at baseline.     Comments: Strength in the upper extremities is symmetric and 5 out of 5.  Palpable radial pulses.  Sensation reportedly intact and symmetric per patient.  No facial droop noted.  Patient answering questions appropriately with appropriate speech.     ED Results / Procedures / Treatments   Labs (all labs ordered are listed, but only abnormal results are displayed) Labs Reviewed - No data to display  EKG None  Radiology MR Cervical Spine W and Wo Contrast Result Date: 08/30/2023 CLINICAL DATA:  63 year old female with acute onset pain radiating from the right ear to the neck at 2200 hours. Abnormal noncontrast neck CT. EXAM: MRI CERVICAL SPINE WITHOUT AND WITH CONTRAST TECHNIQUE: Multiplanar and multiecho pulse sequences of the cervical spine, to include the craniocervical junction and cervicothoracic junction, were obtained without and with intravenous contrast. CONTRAST:  10mL GADAVIST GADOBUTROL 1 MMOL/ML IV SOLN COMPARISON:  Noncontrast neck CT this morning. FINDINGS: Alignment: Stable, straightening of cervical lordosis with degenerative appearing spondylolisthesis C4-C5 and C5-C6. Vertebrae:  Maintained vertebral body height. Chronic degenerative appearing endplate marrow signal changes at C5, sclerotic. Background bone marrow signal within normal limits. No convincing marrow edema or acute osseous abnormality. Cord: Normal despite degenerative spinal cord mass effect detailed below. No cord signal abnormality. No abnormal intradural enhancement or dural thickening. Posterior Fossa, vertebral arteries, paraspinal tissues: Abnormal prevertebral T2/FLAIR hyperintensity and enhancement (series 5, image 9, series 7, image 9) from just below the skull base to the lower C3 vertebral level. On axial images this abuts the longus coli muscles (series 8, image 4 and series 12, image 4). Mild if any edema within the substance of those muscles. No other paraspinal soft tissue enhancement or inflammation. Superimposed retropharyngeal carotids (series 9, image 9). Maintained major vascular flow voids in the major vascular structures in the neck. Left vertebral artery appears mildly dominant. No convincing tonsillar edema  or abnormal enhancement. And the other visible neck soft tissues and lung apices appear negative. Cervicomedullary junction is within normal limits. Negative visible posterior fossa. Disc levels: Degenerative changes most remarkable for C4-C5: Disc space loss. Disc osteophyte complex with broad-based posterior component mild spinal stenosis and mild spinal cord mass effect series 8, image 15. mild right foraminal stenosis. C5-C6: Retrolisthesis with disc space loss. Circumferential disc osteophyte complex. Mild spinal stenosis. Mild spinal cord mass effect. Mild to moderate bilateral C6 foraminal stenosis. C6-C7: Disc space loss with broad-based posterior disc bulge or protrusion. Mild ligament flavum hypertrophy. Mild spinal stenosis. Mild if any cord mass effect. Moderate bilateral C7 foraminal stenosis. IMPRESSION: 1. Retropharyngeal/Prevertebral soft tissues from just below the skull base to C3  are inflamed and enhancing with no fluid collection. No other evidence of cervical spinal enhancement. And visible pharynx and other neck soft tissue spaces appear negative. Recommend empiric treatment for Calcific Tendinitis of the Longus Colli Muscles, with repeat Neck CT with IV contrast if the patient does not improve as expected. 2. Cervical spine degeneration C4-C5 through C6-C7 with mild spinal stenosis, up to moderate degenerative neural foraminal stenosis. Mild spinal cord mass effect but no cord signal abnormality. Electronically Signed   By: Odessa Fleming M.D.   On: 08/30/2023 10:21   CT Soft Tissue Neck Wo Contrast Result Date: 08/30/2023 CLINICAL DATA:  63 year old female with pain radiating from the right ear to the neck acute onset 2200 hours. EXAM: CT NECK WITHOUT CONTRAST TECHNIQUE: Multidetector CT imaging of the neck was performed following the standard protocol without intravenous contrast. RADIATION DOSE REDUCTION: This exam was performed according to the departmental dose-optimization program which includes automated exposure control, adjustment of the mA and/or kV according to patient size and/or use of iterative reconstruction technique. COMPARISON:  None Available. FINDINGS: Pharynx and larynx: Abnormal but nonspecific retropharyngeal space or prevertebral fluid/edema most pronounced at the C2 level (series 3, image 57). Simple fluid density there. Nearby parapharyngeal spaces remain normal. The retropharyngeal space normalizes by the bottom of C3. Noncontrast larynx and pharynx soft tissue contours remain within normal limits. Salivary glands: Negative noncontrast sublingual space, submandibular spaces, parotid and masticator spaces. Thyroid: Negative. Lymph nodes: Negative. Small and symmetric bilateral cervical lymph nodes are identified on this noncontrast exam. Vascular: Calcified carotid bifurcation atherosclerosis, and note a partially retropharyngeal course of the right carotid on series  3, image 60. Vascular patency is not evaluated in the absence of IV contrast. Limited intracranial: Calcified atherosclerosis at the skull base. Negative visible noncontrast brain parenchyma. Visualized orbits: Negative. Mastoids and visualized paranasal sinuses: Paranasal sinuses and mastoids are clear. Bilateral tympanic cavities are clear. Right external auditory canal appears normal. Skeleton: Anterior C1-C2, C1-skull base degeneration including a degenerative ossific fragment in the midline series 9, image 45. 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 No superimposed cervical vertebral bone erosion. Relatively normal for age C2-C3 and C3-C4 disc spaces. Superimposed chronic lower cervical disc and endplate degeneration, although below the level of the retropharyngeal space abnormality (C4-C5 through C6-C7. No acute osseous abnormality identified. Upper chest: Mild respiratory motion. Calcified aortic atherosclerosis. Otherwise negative noncontrast appearance. IMPRESSION: 1. Abnormal low-density fluid/edema in the superior Retropharyngeal or Prevertebral space, primarily at C1 and C2 levels. Underlying bony degenerative changes there including a midline dystrophic calculus. 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 But recommend follow-up Cervical spine MRI without and with  contrast to attempt to exclude the possibility of retropharyngeal abscess from discitis osteomyelitis. 2. No other acute or inflammatory process identified in the noncontrast Neck. 3. Aortic Atherosclerosis (ICD10-I70.0). Calcified carotid atherosclerosis and note partially retropharyngeal course of the right carotid. Electronically Signed   By: Odessa Fleming M.D.   On: 08/30/2023 06:10    Procedures Procedures   Medications Ordered in ED Medications - No data to display  ED Course/ Medical Decision Making/  A&P Clinical Course as of 08/30/23 1739  Sun Aug 30, 2023  1144 MR Cervical Spine W and Wo Contrast [RR]  1158 Spoke with Dr. Jenne Pane  [RR]  1158 On phone with Radiology. Spoke with Dr. Margo Aye.  [RR]    Clinical Course User Index [RR] Achille Rich, PA-C                                Medical Decision Making Amount and/or Complexity of Data Reviewed Labs: ordered. Radiology: ordered. Decision-making details documented in ED Course.  Risk OTC drugs. Prescription drug management.   63 y.o. female presents to the ER for evaluation of neck pain. Differential diagnosis includes but is not limited to muscle strain, muscle sprain, meningitis, abscess, mastoiditis, vascular issue, osteo. Vital signs elevated blood pressure otherwise unremarkable. Physical exam as noted above.   On previous chart evaluation, patient was just seen less than 1 hour previously.  Had to leave to go home to handle something per patient.  She had a CT scan done without contrast given her allergy.  Imaging of the elbow broad differential for possible abscess to possible discitis as well.  They recommended MRI of the C-spine.  The patient left prior to getting this done.  She is not returning.  Will order MRI of the C-spine as well as continue with the labs ordered.  I independently reviewed and interpreted the patient's labs.  Strep negative.  CBC with a leukocytosis or anemia.  CMP shows mildly elevated glucose at 113.  No other electrolyte or LFT abnormality.  CT scan shows 1. Abnormal low-density fluid/edema in the superior Retropharyngeal or Prevertebral space, primarily at C1 and C2 levels. Underlying bony degenerative changes there including a midline dystrophic calculus. 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 But recommend follow-up Cervical spine MRI without and with contrast to attempt to exclude the possibility of retropharyngeal abscess  from discitis osteomyelitis. 2. No other acute or inflammatory process identified in the noncontrast Neck. 3. Aortic Atherosclerosis (ICD10-I70.0). Calcified carotid atherosclerosis and note partially retropharyngeal course of the right carotid. Per radiologist's interpretation.    MRI shows Retropharyngeal/Prevertebral soft tissues from just below the skull base to C3 are inflamed and enhancing with no fluid collection. No other evidence of cervical spinal enhancement. And visible pharynx and other neck soft tissue spaces appear negative. Recommend empiric treatment for Calcific Tendinitis of the Longus Colli Muscles, with repeat Neck CT with IV contrast if the patient does not improve as expected. 2. Cervical spine degeneration C4-C5 through C6-C7 with mild spinal stenosis, up to moderate degenerative neural foraminal stenosis. Mild spinal cord mass effect but no cord signal abnormality. Per radiologist's interpretation.    Given read, I consulted ENT and spoke with Dr. Jenne Pane. 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 Asks that I call the radioologist to review film.   I called radiology and spoke with Dr. Margo Aye who reviewed the imaging. 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 "Anterior C1-C2, C1-skull base degeneration including a degenerative ossific fragment in the midline series 9, image 45." This finding he reports is found with the CTLCM, but can not say with definitively that this is inflammation versus infectious in etiology.   I re-consulted Dr. Jenne Pane with updated information. We have decided, out of an abundance of precaution, to treat both with Augmentin BID x 10 days as well as anti-inflammatories which is recommend per literature review. Given her intolerance to  ibuprofen, will opt for prednisone which she has tolerated before. Will give extended burst with prednisone 50mg  x 7 days. He will see her in the office this week. Recommends giving a dose of IV unasyn while here.  No other recommendations.  I discussed results, my consultation discussions, as well as plan with patient and family at discharge. They are agreeable and assure me she will follow up with ENT. She still does not appear in any acute distress. Will discharge home with close outpatient follow up and strict return precautions  We discussed the results of the labs/imaging. The plan is take medications as prescribed, follow up with ENT. We discussed strict return precautions and red flag symptoms. The patient verbalized their understanding and agrees to the plan. The patient is stable and being discharged home in good condition.  Portions of this report may have been transcribed using voice recognition software. Every effort was made to ensure accuracy; however, inadvertent computerized transcription errors may be present.    Final Clinical Impression(s) / ED Diagnoses Final diagnoses:  Neck pain  Tendonitis    Rx / DC Orders ED Discharge Orders          Ordered    predniSONE (DELTASONE) 50 MG tablet  Daily        08/30/23 1409    amoxicillin-clavulanate (AUGMENTIN) 875-125 MG tablet  Every 12 hours        08/30/23 1409              Achille Rich, PA-C 08/30/23 1758    Achille Rich, PA-C 08/30/23 Orpah Cobb, MD 09/01/23 [phone]

## 2023-08-30 NOTE — ED Provider Notes (Addendum)
 [facility] Provider Note   CSN: [id number] Arrival date & time: 08/30/23  4098     History  Chief Complaint  Patient presents with   Ear Pain    Kristyne Woodring is a 63 y.o. female with a history of asthma who presents to the ED today for ear and neck pain.  Patient reports that 3 hours prior to arrival she developed pain at the right ear as well as the right neck.  Pain is at the lateral aspect of the right neck and does not incorporate the cervical spine.  No decreased hearing out of the ear or drainage. States that pain is worse with swallowing and movement.  Denies fevers, dental pain, or swelling of the oropharynx.  No chest pain or shortness of breath.  Denies recent injury or trauma.  No additional complaints or concerns at this time.    Home Medications Prior to Admission medications   Medication Sig Start Date End Date Taking? Authorizing Provider  albuterol (VENTOLIN HFA) 108 (90 Base) MCG/ACT inhaler Inhale 1-2 puffs into the lungs every 6 (six) hours as needed for wheezing or shortness of breath. 03/26/23  Yes Bethann Berkshire, MD  amoxicillin-clavulanate (AUGMENTIN) 875-125 MG tablet Take 1 tablet by mouth every 12 (twelve) hours. Patient not taking: Reported on 08/30/2023 11/05/21   Achille Rich, PA-C  budesonide-formoterol [facility]) 80-4.5 MCG/ACT inhaler Inhale 2 puffs into the lungs 2 (two) times daily. Patient not taking: Reported on 08/30/2023 03/27/20   Ivonne Andrew, NP  cyclobenzaprine (FLEXERIL) 10 MG tablet Take 1 tablet (10 mg total) by mouth 2 (two) times daily as needed for muscle spasms. Patient not taking: Reported on 08/30/2023 07/21/21   Virgina Norfolk, DO  montelukast (SINGULAIR) 10 MG tablet Take 1 tablet (10 mg total) by mouth at bedtime. Patient not taking: Reported on 08/30/2023 03/27/20   Ivonne Andrew, NP  oseltamivir (TAMIFLU) 75 MG capsule Take 1 capsule (75 mg total) by mouth every 12 (twelve)  hours. Patient not taking: Reported on 08/30/2023 06/28/23   Wynetta Fines, MD  predniSONE (DELTASONE) 10 MG tablet Take 4 tablets (40 mg total) by mouth daily. Patient not taking: Reported on 08/30/2023 06/28/23   Wynetta Fines, MD  predniSONE (DELTASONE) 20 MG tablet 2 tabs po daily x 3 days Patient not taking: Reported on 08/30/2023 03/26/23   Bethann Berkshire, MD      Allergies    Contrast media [iodinated contrast media], Oxycodone-acetaminophen, Percocet [oxycodone-acetaminophen], and Motrin [ibuprofen]    Review of Systems   Review of Systems  Musculoskeletal:  Positive for neck pain.  All other systems reviewed and are negative.   Physical Exam Updated Vital Signs BP (!) 164/85 (BP Location: Right Arm)   Pulse (!) 58   Temp 98.1 F (36.7 C) (Oral)   Resp 16   Ht 5\' 2"  (1.575 m)   Wt 113.4 kg   SpO2 100%   BMI 45.73 kg/m  Physical Exam Vitals and nursing note reviewed.  Constitutional:      General: She is not in acute distress.    Appearance: Normal appearance.  HENT:     Head: Normocephalic and atraumatic.     Right Ear: Tympanic membrane and ear canal normal.     Left Ear: Tympanic membrane and ear canal normal.     Mouth/Throat:     Mouth: Mucous membranes are moist.  Eyes:     Conjunctiva/sclera: Conjunctivae normal.  Pupils: Pupils are equal, round, and reactive to light.  Neck:     Comments: Slight swelling to the right side of the neck and jaw with tenderness to palpation.  ROM of neck remains intact.  No midline tenderness, step-off, or deformity to palpation. Cardiovascular:     Rate and Rhythm: Normal rate and regular rhythm.     Pulses: Normal pulses.     Heart sounds: Normal heart sounds.  Pulmonary:     Effort: Pulmonary effort is normal.     Breath sounds: Normal breath sounds.  Abdominal:     Palpations: Abdomen is soft.     Tenderness: There is no abdominal tenderness.  Musculoskeletal:        General: Normal range of motion.     Cervical  back: Normal range of motion. Tenderness present.  Skin:    General: Skin is warm and dry.     Findings: No rash.  Neurological:     General: No focal deficit present.     Mental Status: She is alert.  Psychiatric:        Mood and Affect: Mood normal.        Behavior: Behavior normal.    ED Results / Procedures / Treatments   Labs (all labs ordered are listed, but only abnormal results are displayed) Labs Reviewed  BASIC METABOLIC PANEL WITH GFR  CBC WITH DIFFERENTIAL/PLATELET    EKG None  Radiology CT Soft Tissue Neck Wo Contrast Result Date: 08/30/2023 CLINICAL DATA:  63 year old female with pain radiating from the right ear to the neck acute onset 2200 hours. EXAM: CT NECK WITHOUT CONTRAST TECHNIQUE: Multidetector CT imaging of the neck was performed following the standard protocol without intravenous contrast. RADIATION DOSE REDUCTION: This exam was performed according to the departmental dose-optimization program which includes automated exposure control, adjustment of the mA and/or kV according to patient size and/or use of iterative reconstruction technique. COMPARISON:  None Available. FINDINGS: Pharynx and larynx: Abnormal but nonspecific retropharyngeal space or prevertebral fluid/edema most pronounced at the C2 level (series 3, image 57). Simple fluid density there. Nearby parapharyngeal spaces remain normal. The retropharyngeal space normalizes by the bottom of C3. Noncontrast larynx and pharynx soft tissue contours remain within normal limits. Salivary glands: Negative noncontrast sublingual space, submandibular spaces, parotid and masticator spaces. Thyroid: Negative. Lymph nodes: Negative. Small and symmetric bilateral cervical lymph nodes are identified on this noncontrast exam. Vascular: Calcified carotid bifurcation atherosclerosis, and note a partially retropharyngeal course of the right carotid on series 3, image 60. Vascular patency is not evaluated in the absence of IV  contrast. Limited intracranial: Calcified atherosclerosis at the skull base. Negative visible noncontrast brain parenchyma. Visualized orbits: Negative. Mastoids and visualized paranasal sinuses: Paranasal sinuses and mastoids are clear. Bilateral tympanic cavities are clear. Right external auditory canal appears normal. Skeleton: Anterior C1-C2, C1-skull base degeneration including a degenerative ossific fragment in the midline series 9, image 45. 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 No superimposed cervical vertebral bone erosion. Relatively normal for age C2-C3 and C3-C4 disc spaces. Superimposed chronic lower cervical disc and endplate degeneration, although below the level of the retropharyngeal space abnormality (C4-C5 through C6-C7. No acute osseous abnormality identified. Upper chest: Mild respiratory motion. Calcified aortic atherosclerosis. Otherwise negative noncontrast appearance. IMPRESSION: 1. Abnormal low-density fluid/edema in the superior Retropharyngeal or Prevertebral space, primarily at C1 and C2 levels. Underlying bony degenerative changes there including a midline dystrophic calculus. 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 But recommend follow-up Cervical spine MRI without and with contrast to attempt to exclude the possibility of retropharyngeal abscess from discitis osteomyelitis. 2. No other acute or inflammatory process identified in the noncontrast Neck. 3. Aortic Atherosclerosis (ICD10-I70.0). Calcified carotid atherosclerosis and note partially retropharyngeal course of the right carotid. Electronically Signed   By: Odessa Fleming M.D.   On: 08/30/2023 06:10    Procedures Procedures: not indicated.   Medications Ordered in ED Medications  acetaminophen (TYLENOL) tablet 650 mg (650 mg Oral Given 08/30/23 0509)    ED Course/ Medical Decision  Making/ A&P                                 Medical Decision Making Amount and/or Complexity of Data Reviewed Labs: ordered. Radiology: ordered.  Risk OTC drugs.   This patient presents to the ED for concern of neck and ear pain, this involves an extensive number of treatment options, and is a complaint that carries with it a high risk of complications and morbidity.   Differential diagnosis includes: otitis media, otitis externa, lymphadenopathy, radiculopathy, cellulitis, muscle strain/spasm, etc.   Comorbidities  See HPI above   Additional History  Additional history obtained from prior records   Imaging Studies  I ordered imaging studies including CT soft tissue without contrast I independently visualized and interpreted imaging which showed:  1. Abnormal low-density fluid/edema in the superior Retropharyngeal or Prevertebral space, primarily at C1 and C2 levels. Underlying bony degenerative changes there including a midline dystrophic calculus. 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 But recommend follow-up Cervical spine MRI without and with contrast to attempt to exclude the possibility of retropharyngeal abscess from discitis osteomyelitis. 2. No other acute or inflammatory process identified in the noncontrast Neck.  I agree with the radiologist interpretation   Problem List / ED Course / Critical Interventions / Medication Management  Right sided neck and ear pain that started about 3 hours prior to arrival.  Denies any injury or trauma prior to onset of symptoms.  Denies any changes to hearing or discharge from ear.  Maintains full range of motion of neck despite pain. Patient has a large neck at baseline but appears to have some swelling on the right lateral neck compared to left.  No dental pain or swelling of the oropharynx. Patient staffed with my attending, Dr. Bebe Shaggy, who  evaluated patient as well. I ordered medications including: Tylenol for pain  Reevaluation of the patient after these medicines showed that the patient improved Discussed findings of CT with patient.  Told her that based on the findings and MRI was recommended to rule out osteomyelitis and retropharyngeal abscess.  Discussed that there can be life-threatening complications if these conditions go untreated.  Patient was agreeable for MRI. Several minutes later patient changed her mind did not want imaging according to the nurse.  She eloped after that.  Labs and MRI were not done.   Social Determinants of Health  Access to healthcare   Test / Admission - Considered  Patient eloped prior to MRI cervical spine and labs.      Final Clinical Impression(s) / ED Diagnoses Final diagnoses:  Neck pain    Rx / DC Orders ED Discharge Orders     None  Maxwell Marion, PA-C 08/30/23 0658    Maxwell Marion, PA-C 08/30/23 1610    Zadie Rhine, MD 08/30/23 [phone]

## 2023-08-30 NOTE — Discharge Instructions (Addendum)
 You were seen in the ER today for evaluation of your neck pain. 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 You will need to follow up with an ENT specialist as this will need monitoring and re-evaluation. You will be on two medications that you should take daily. Please take as prescribed and complete the course of both. Dr. Jenne Pane will see you this week, but you will need to call his office to schedule an appointment. If you have any concerns, new or worsening symptoms, please return to the ER for re-evaluation.   Contact a doctor if: You have more pain, swelling, redness, or pus in your throat. You have a headache. You have low energy or feel generally sick. You have a fever or chills. You have trouble swallowing or eating. You have signs of not enough water in the body (dehydration), such as: Feeling light-headed or dizzy when you are standing. Peeing (urinating) less than usual. A fast heart rate. Dry mouth. Get help right away if: You are unable to swallow. You have trouble breathing. Breathing is easier when you lean forward. You cough up blood. You vomit blood. You have very bad throat pain and it does not get better with medicine. These symptoms may be an emergency. Get help right away. Call your local emergency services (911 in the [place] Do not wait to see if the symptoms will go away. Do not drive yourself to the hospital.
